# Patient Record
Sex: Female | Born: 1975 | Race: White | Hispanic: No | Marital: Married | Smoking: Never smoker
Health system: Southern US, Community
[De-identification: ages and names within clinical notes are randomized; demographics above are authoritative.]

## PROBLEM LIST (undated history)

## (undated) DIAGNOSIS — E049 Nontoxic goiter, unspecified: Secondary | ICD-10-CM

## (undated) DIAGNOSIS — Z8619 Personal history of other infectious and parasitic diseases: Secondary | ICD-10-CM

## (undated) DIAGNOSIS — N3010 Interstitial cystitis (chronic) without hematuria: Secondary | ICD-10-CM

## (undated) DIAGNOSIS — C801 Malignant (primary) neoplasm, unspecified: Secondary | ICD-10-CM

## (undated) DIAGNOSIS — Z9889 Other specified postprocedural states: Secondary | ICD-10-CM

## (undated) DIAGNOSIS — T8859XA Other complications of anesthesia, initial encounter: Secondary | ICD-10-CM

## (undated) DIAGNOSIS — R519 Headache, unspecified: Secondary | ICD-10-CM

## (undated) DIAGNOSIS — K219 Gastro-esophageal reflux disease without esophagitis: Secondary | ICD-10-CM

## (undated) DIAGNOSIS — R87619 Unspecified abnormal cytological findings in specimens from cervix uteri: Secondary | ICD-10-CM

## (undated) DIAGNOSIS — Z1371 Encounter for nonprocreative screening for genetic disease carrier status: Secondary | ICD-10-CM

## (undated) DIAGNOSIS — Z9189 Other specified personal risk factors, not elsewhere classified: Secondary | ICD-10-CM

## (undated) DIAGNOSIS — Z803 Family history of malignant neoplasm of breast: Secondary | ICD-10-CM

## (undated) DIAGNOSIS — B029 Zoster without complications: Secondary | ICD-10-CM

## (undated) DIAGNOSIS — R112 Nausea with vomiting, unspecified: Secondary | ICD-10-CM

## (undated) HISTORY — DX: Personal history of other infectious and parasitic diseases: Z86.19

## (undated) HISTORY — DX: Encounter for nonprocreative screening for genetic disease carrier status: Z13.71

## (undated) HISTORY — DX: Other specified personal risk factors, not elsewhere classified: Z91.89

## (undated) HISTORY — DX: Nontoxic goiter, unspecified: E04.9

## (undated) HISTORY — DX: Zoster without complications: B02.9

## (undated) HISTORY — DX: Family history of malignant neoplasm of breast: Z80.3

## (undated) HISTORY — DX: Unspecified abnormal cytological findings in specimens from cervix uteri: R87.619

## (undated) HISTORY — DX: Interstitial cystitis (chronic) without hematuria: N30.10

## (undated) HISTORY — PX: WISDOM TOOTH EXTRACTION: SHX21

---

## 1997-11-22 DIAGNOSIS — R87619 Unspecified abnormal cytological findings in specimens from cervix uteri: Secondary | ICD-10-CM

## 1997-11-22 HISTORY — DX: Unspecified abnormal cytological findings in specimens from cervix uteri: R87.619

## 1997-11-22 HISTORY — PX: COLPOSCOPY: SHX161

## 2003-11-23 HISTORY — PX: DILATION AND CURETTAGE OF UTERUS: SHX78

## 2005-05-14 ENCOUNTER — Inpatient Hospital Stay: Payer: Self-pay | Admitting: Unknown Physician Specialty

## 2007-01-30 ENCOUNTER — Ambulatory Visit: Payer: Self-pay

## 2009-11-20 ENCOUNTER — Ambulatory Visit: Payer: Self-pay | Admitting: Family Medicine

## 2010-03-04 ENCOUNTER — Ambulatory Visit: Payer: Self-pay | Admitting: Unknown Physician Specialty

## 2010-03-10 ENCOUNTER — Ambulatory Visit: Payer: Self-pay | Admitting: Unknown Physician Specialty

## 2010-12-30 ENCOUNTER — Ambulatory Visit: Payer: Self-pay

## 2011-04-05 ENCOUNTER — Ambulatory Visit: Payer: Self-pay | Admitting: Family Medicine

## 2013-11-30 ENCOUNTER — Ambulatory Visit: Payer: Self-pay | Admitting: Family Medicine

## 2014-10-04 ENCOUNTER — Ambulatory Visit: Payer: Self-pay | Admitting: Family Medicine

## 2015-06-19 ENCOUNTER — Encounter
Admission: RE | Admit: 2015-06-19 | Discharge: 2015-06-19 | Disposition: A | Discharge status: Home / Self Care | Payer: BLUE CROSS/BLUE SHIELD | Source: Ambulatory Visit | Attending: Obstetrics & Gynecology | Admitting: Obstetrics & Gynecology

## 2015-06-19 DIAGNOSIS — Z01812 Encounter for preprocedural laboratory examination: Secondary | ICD-10-CM | POA: Insufficient documentation

## 2015-06-19 HISTORY — DX: Gastro-esophageal reflux disease without esophagitis: K21.9

## 2015-06-19 LAB — BASIC METABOLIC PANEL
ANION GAP: 7 (ref 5–15)
BUN: 14 mg/dL (ref 6–20)
CHLORIDE: 106 mmol/L (ref 101–111)
CO2: 27 mmol/L (ref 22–32)
CREATININE: 0.6 mg/dL (ref 0.44–1.00)
Calcium: 9.4 mg/dL (ref 8.9–10.3)
Glucose, Bld: 97 mg/dL (ref 65–99)
POTASSIUM: 4.1 mmol/L (ref 3.5–5.1)
Sodium: 140 mmol/L (ref 135–145)

## 2015-06-19 LAB — CBC
HCT: 42.7 % (ref 35.0–47.0)
Hemoglobin: 14.5 g/dL (ref 12.0–16.0)
MCH: 31 pg (ref 26.0–34.0)
MCHC: 34 g/dL (ref 32.0–36.0)
MCV: 91.1 fL (ref 80.0–100.0)
Platelets: 304 10*3/uL (ref 150–440)
RBC: 4.68 MIL/uL (ref 3.80–5.20)
RDW: 12.3 % (ref 11.5–14.5)
WBC: 6.4 10*3/uL (ref 3.6–11.0)

## 2015-06-19 LAB — TYPE AND SCREEN
ABO/RH(D): O POS
Antibody Screen: NEGATIVE

## 2015-06-19 NOTE — Patient Instructions (Signed)
  Your procedure is scheduled on: June 27, 2015 (Friday) Report to Day Surgery. To find out your arrival time please call [phone] between 1PM - 3PM on June 26, 2015 (Thursday).  Remember: Instructions that are not followed completely may result in serious medical risk, up to and including death, or upon the discretion of your surgeon and anesthesiologist your surgery may need to be rescheduled.    __x__ 1. Do not eat food or drink liquids after midnight. No gum chewing or hard candies.     ____ 2. No Alcohol for 24 hours before or after surgery.   ____ 3. Bring all medications with you on the day of surgery if instructed.    __x__ 4. Notify your doctor if there is any change in your medical condition     (cold, fever, infections).     Do not wear jewelry, make-up, hairpins, clips or nail polish.  Do not wear lotions, powders, or perfumes. You may wear deodorant.  Do not shave 48 hours prior to surgery. Men may shave face and neck.  Do not bring valuables to the hospital.    [facility] is not responsible for any belongings or valuables.               Contacts, dentures or bridgework may not be worn into surgery.  Leave your suitcase in the car. After surgery it may be brought to your room.  For patients admitted to the hospital, discharge time is determined by your                treatment team.   Patients discharged the day of surgery will not be allowed to drive home.   Please read over the following fact sheets that you were given:     ____ Take these medicines the morning of surgery with A SIP OF WATER:    1.   2.   3.   4.  5.  6.  ____ Fleet Enema (as directed)   ____ Use CHG Soap as directed  ____ Use inhalers on the day of surgery  ____ Stop metformin 2 days prior to surgery    ____ Take 1/2 of usual insulin dose the night before surgery and none on the morning of surgery.   ____ Stop Coumadin/Plavix/aspirin on   ____ Stop Anti-inflammatories on     ____ Stop supplements until after surgery.    ____ Bring C-Pap to the hospital.

## 2015-06-20 LAB — ABO/RH: ABO/RH(D): O POS

## 2015-06-27 ENCOUNTER — Ambulatory Visit: Payer: BLUE CROSS/BLUE SHIELD | Admitting: Anesthesiology

## 2015-06-27 ENCOUNTER — Encounter
Admission: RE | Disposition: A | Discharge status: Home / Self Care | Payer: Self-pay | Source: Ambulatory Visit | Attending: Obstetrics & Gynecology

## 2015-06-27 ENCOUNTER — Encounter: Payer: Self-pay | Admitting: *Deleted

## 2015-06-27 ENCOUNTER — Ambulatory Visit
Admission: RE | Admit: 2015-06-27 | Discharge: 2015-06-27 | Disposition: A | Discharge status: Home / Self Care | Payer: BLUE CROSS/BLUE SHIELD | Source: Ambulatory Visit | Attending: Obstetrics & Gynecology | Admitting: Obstetrics & Gynecology

## 2015-06-27 DIAGNOSIS — K219 Gastro-esophageal reflux disease without esophagitis: Secondary | ICD-10-CM | POA: Diagnosis not present

## 2015-06-27 DIAGNOSIS — N939 Abnormal uterine and vaginal bleeding, unspecified: Secondary | ICD-10-CM | POA: Diagnosis present

## 2015-06-27 DIAGNOSIS — Z800 Family history of malignant neoplasm of digestive organs: Secondary | ICD-10-CM | POA: Insufficient documentation

## 2015-06-27 DIAGNOSIS — Z803 Family history of malignant neoplasm of breast: Secondary | ICD-10-CM | POA: Diagnosis not present

## 2015-06-27 DIAGNOSIS — Z8379 Family history of other diseases of the digestive system: Secondary | ICD-10-CM | POA: Diagnosis not present

## 2015-06-27 DIAGNOSIS — N840 Polyp of corpus uteri: Secondary | ICD-10-CM | POA: Insufficient documentation

## 2015-06-27 DIAGNOSIS — Z8049 Family history of malignant neoplasm of other genital organs: Secondary | ICD-10-CM | POA: Diagnosis not present

## 2015-06-27 DIAGNOSIS — Z808 Family history of malignant neoplasm of other organs or systems: Secondary | ICD-10-CM | POA: Insufficient documentation

## 2015-06-27 DIAGNOSIS — R938 Abnormal findings on diagnostic imaging of other specified body structures: Secondary | ICD-10-CM | POA: Diagnosis not present

## 2015-06-27 DIAGNOSIS — Z8249 Family history of ischemic heart disease and other diseases of the circulatory system: Secondary | ICD-10-CM | POA: Diagnosis not present

## 2015-06-27 HISTORY — PX: HYSTEROSCOPY WITH D & C: SHX1775

## 2015-06-27 LAB — POCT PREGNANCY, URINE: Preg Test, Ur: NEGATIVE

## 2015-06-27 SURGERY — DILATATION AND CURETTAGE /HYSTEROSCOPY
Anesthesia: General

## 2015-06-27 MED ORDER — LIDOCAINE HCL (CARDIAC) 20 MG/ML IV SOLN
INTRAVENOUS | Status: DC | PRN
  Administered 2015-06-27: 100 mg via INTRAVENOUS

## 2015-06-27 MED ORDER — IBUPROFEN 600 MG PO TABS: 600.0000 mg | ORAL_TABLET | Freq: Four times a day (QID) | ORAL | Status: DC | PRN

## 2015-06-27 MED ORDER — FAMOTIDINE 20 MG PO TABS
20.0000 mg | ORAL_TABLET | Freq: Once | ORAL | Status: AC
  Administered 2015-06-27: 20 mg via ORAL

## 2015-06-27 MED ORDER — FENTANYL CITRATE (PF) 100 MCG/2ML IJ SOLN
INTRAMUSCULAR | Status: DC | PRN
  Administered 2015-06-27 (×2): 50 ug via INTRAVENOUS

## 2015-06-27 MED ORDER — ONDANSETRON HCL 4 MG/2ML IJ SOLN
INTRAMUSCULAR | Status: DC | PRN
  Administered 2015-06-27: 4 mg via INTRAVENOUS

## 2015-06-27 MED ORDER — GLYCOPYRROLATE 0.2 MG/ML IJ SOLN
INTRAMUSCULAR | Status: DC | PRN
  Administered 2015-06-27: 0.2 mg via INTRAVENOUS

## 2015-06-27 MED ORDER — DEXAMETHASONE SODIUM PHOSPHATE 4 MG/ML IJ SOLN
INTRAMUSCULAR | Status: DC | PRN
  Administered 2015-06-27: 10 mg via INTRAVENOUS

## 2015-06-27 MED ORDER — LACTATED RINGERS IV SOLN
INTRAVENOUS | Status: DC
  Administered 2015-06-27: 13:00:00 via INTRAVENOUS

## 2015-06-27 MED ORDER — LIDOCAINE HCL (PF) 1 % IJ SOLN
INTRAMUSCULAR | Status: AC
  Filled 2015-06-27: qty 30

## 2015-06-27 MED ORDER — MIDAZOLAM HCL 2 MG/2ML IJ SOLN
INTRAMUSCULAR | Status: DC | PRN
  Administered 2015-06-27: 2 mg via INTRAVENOUS

## 2015-06-27 MED ORDER — PROPOFOL 10 MG/ML IV BOLUS
INTRAVENOUS | Status: DC | PRN
  Administered 2015-06-27: 160 mg via INTRAVENOUS

## 2015-06-27 MED ORDER — FAMOTIDINE 20 MG PO TABS
ORAL_TABLET | ORAL | Status: AC
  Administered 2015-06-27: 20 mg via ORAL
  Filled 2015-06-27: qty 1

## 2015-06-27 SURGICAL SUPPLY — 19 items
CATH ROBINSON RED A/P 16FR (CATHETERS) ×3 IMPLANT
CORD URO TURP 10FT (MISCELLANEOUS) IMPLANT
ELECT RESECT POWERBALL 24F (MISCELLANEOUS) IMPLANT
GLOVE BIOGEL PI IND STRL 6.5 (GLOVE) ×2 IMPLANT
GLOVE BIOGEL PI INDICATOR 6.5 (GLOVE) ×4
GLOVE SURG SYN 6.5 ES PF (GLOVE) ×6 IMPLANT
GOWN STRL REUS W/ TWL LRG LVL3 (GOWN DISPOSABLE) ×2 IMPLANT
GOWN STRL REUS W/TWL LRG LVL3 (GOWN DISPOSABLE) ×4
IV LACTATED RINGERS 1000ML (IV SOLUTION) ×3 IMPLANT
KIT RM TURNOVER CYSTO AR (KITS) ×3 IMPLANT
NEEDLE SPNL 22GX3.5 QUINCKE BK (NEEDLE) IMPLANT
PACK DNC HYST (MISCELLANEOUS) ×3 IMPLANT
PAD GROUND ADULT SPLIT (MISCELLANEOUS) ×3 IMPLANT
PAD OB MATERNITY 4.3X12.25 (PERSONAL CARE ITEMS) ×3 IMPLANT
PAD PREP 24X41 OB/GYN DISP (PERSONAL CARE ITEMS) ×3 IMPLANT
SYRINGE 10CC LL (SYRINGE) ×3 IMPLANT
TUBING CONNECTING 10 (TUBING) ×2 IMPLANT
TUBING CONNECTING 10' (TUBING) ×1
TUBING HYSTEROSCOPY DOLPHIN (MISCELLANEOUS) IMPLANT

## 2015-06-27 NOTE — Anesthesia Preprocedure Evaluation (Signed)
Anesthesia Evaluation  Patient identified by MRN, date of birth, ID band Patient awake    Reviewed: Allergy & Precautions, H&P , NPO status , Patient's Chart, lab work & pertinent test results, reviewed documented beta blocker date and time   History of Anesthesia Complications Negative for: history of anesthetic complications  Airway Mallampati: I  TM Distance: >3 FB Neck ROM: full    Dental no notable dental hx.    Pulmonary neg pulmonary ROS,  breath sounds clear to auscultation  Pulmonary exam normal       Cardiovascular Exercise Tolerance: Good negative cardio ROS Normal cardiovascular examRhythm:regular Rate:Normal     Neuro/Psych negative neurological ROS  negative psych ROS   GI/Hepatic Neg liver ROS, GERD-  ,  Endo/Other  negative endocrine ROS  Renal/GU negative Renal ROS  negative genitourinary   Musculoskeletal   Abdominal   Peds  Hematology negative hematology ROS (+)   Anesthesia Other Findings Past Medical History:   GERD (gastroesophageal reflux disease)                       Reproductive/Obstetrics negative OB ROS                             Anesthesia Physical Anesthesia Plan  ASA: II  Anesthesia Plan: General   Post-op Pain Management:    Induction:   Airway Management Planned:   Additional Equipment:   Intra-op Plan:   Post-operative Plan:   Informed Consent: I have reviewed the patients History and Physical, chart, labs and discussed the procedure including the risks, benefits and alternatives for the proposed anesthesia with the patient or authorized representative who has indicated his/her understanding and acceptance.   Dental Advisory Given  Plan Discussed with: Anesthesiologist, CRNA and Surgeon  Anesthesia Plan Comments:         Anesthesia Quick Evaluation

## 2015-06-27 NOTE — Anesthesia Procedure Notes (Signed)
Procedure Name: LMA Insertion Date/Time: 06/27/2015 1:01 PM Performed by: Silvana Newness Pre-anesthesia Checklist: Patient identified, Emergency Drugs available, Suction available, Patient being monitored and Timeout performed Patient Re-evaluated:Patient Re-evaluated prior to inductionOxygen Delivery Method: Circle system utilized Preoxygenation: Pre-oxygenation with 100% oxygen Intubation Type: IV induction Ventilation: Mask ventilation without difficulty LMA: LMA inserted LMA Size: 3.5 Number of attempts: 1 Placement Confirmation: ETT inserted through vocal cords under direct vision,  positive ETCO2 and breath sounds checked- equal and bilateral Tube secured with: Tape Dental Injury: Teeth and Oropharynx as per pre-operative assessment

## 2015-06-27 NOTE — Transfer of Care (Signed)
Immediate Anesthesia Transfer of Care Note  Patient: Amanda Coffey  Procedure(s) Performed: Procedure(s): DILATATION AND CURETTAGE /HYSTEROSCOPY (N/A)  Patient Location: PACU  Anesthesia Type:General  Level of Consciousness: awake, alert , oriented and patient cooperative  Airway & Oxygen Therapy: Patient Spontanous Breathing and Patient connected to face mask oxygen  Post-op Assessment: Report given to RN, Post -op Vital signs reviewed and stable and Patient moving all extremities X 4  Post vital signs: Reviewed and stable  Last Vitals:  Filed Vitals:   06/27/15 1350  BP: 130/88  Pulse: 92  Temp: 36.2 C  Resp: 17    Complications: No apparent anesthesia complications

## 2015-06-27 NOTE — Discharge Instructions (Signed)
AMBULATORY SURGERY  DISCHARGE INSTRUCTIONS   1) The drugs that you were given will stay in your system until tomorrow so for the next 24 hours you should not:  A) Drive an automobile B) Make any legal decisions C) Drink any alcoholic beverage   2) You may resume regular meals tomorrow.  Today it is better to start with liquids and gradually work up to solid foods.  You may eat anything you prefer, but it is better to start with liquids, then soup and crackers, and gradually work up to solid foods.   3) Please notify your doctor immediately if you have any unusual bleeding, trouble breathing, redness and pain at the surgery site, drainage, fever, or pain not relieved by medication.    4) Additional Instructions:   Dilation and Curettage or Vacuum Curettage, Care After Refer to this sheet in the next few weeks. These instructions provide you with information on caring for yourself after your procedure. Your health care provider may also give you more specific instructions. Your treatment has been planned according to current medical practices, but problems sometimes occur. Call your health care provider if you have any problems or questions after your procedure. WHAT TO EXPECT AFTER THE PROCEDURE After your procedure, it is typical to have light cramping and bleeding. This may last for 2 days to 2 weeks after the procedure. HOME CARE INSTRUCTIONS   Do not drive for 24 hours.  Wait 1 week before returning to strenuous activities.  Take your temperature 2 times a day for 4 days and write it down. Provide these temperatures to your health care provider if you develop a fever.  Avoid long periods of standing.  Avoid heavy lifting, pushing, or pulling. Do not lift anything heavier than 10 pounds (4.5 kg).  Limit stair climbing to once or twice a day.  Take rest periods often.  You may resume your usual diet.  Drink enough fluids to keep your urine clear or pale yellow.  Your  usual bowel function should return. If you have constipation, you may:  Take a mild laxative with permission from your health care provider.  Add fruit and bran to your diet.  Drink more fluids.  Take showers instead of baths until your health care provider gives you permission to take baths.  Do not go swimming or use a hot tub until your health care provider approves.  Try to have someone with you or available to you the first 24-48 hours, especially if you were given a general anesthetic.  Do not douche, use tampons, or have sex (intercourse) for 2 weeks after the procedure.  Only take over-the-counter or prescription medicines as directed by your health care provider. Do not take aspirin. It can cause bleeding.  Follow up with your health care provider as directed. SEEK MEDICAL CARE IF:   You have increasing cramps or pain that is not relieved with medicine.  You have abdominal pain that does not seem to be related to the same area of earlier cramping and pain.  You have bad smelling vaginal discharge.  You have a rash.  You are having problems with any medicine. SEEK IMMEDIATE MEDICAL CARE IF:   You have bleeding that is heavier than a normal menstrual period.  You have a fever.  You have chest pain.  You have shortness of breath.  You feel dizzy or feel like fainting.  You pass out.  You have pain in your shoulder strap area.  You have heavy  vaginal bleeding with or without blood clots. MAKE SURE YOU:   Understand these instructions.  Will watch your condition.  Will get help right away if you are not doing well or get worse. Document Released: 11/05/2000 Document Revised: 11/13/2013 Document Reviewed: 06/07/2013 [facility] Patient Information 2015 [place], [place]. This information is not intended to replace advice given to you by your health care provider. Make sure you discuss any questions you have with your health care provider.      Please contact  your physician with any problems or Same Day Surgery at [phone], Monday through Friday 6 am to 4 pm, or [place] at [facility] number at [phone].

## 2015-06-27 NOTE — Op Note (Signed)
Operative Report Hysteroscopy, Dilation and Curettage 06/27/2015  Patient:  Amanda Coffey  39 y.o. female Preoperative diagnosis:  ABNORMAL UTERINE BLEEDING, thickened endometrium Postoperative diagnosis:  same  PROCEDURE:  Procedure(s): DILATATION AND CURETTAGE /HYSTEROSCOPY (N/A) Surgeon:  Surgeon(s) and Role:    * Latessa Tillis Loletha Grayer Jacion Dismore, MD - Primary Assistant: none Anesthesia:  LMA I/O: Total I/O In: 700 [I.V.:700] Out: 325 [Urine:300; Blood:25] Specimens:  Endometrial curettings Complications: None Apparent Disposition:  VS stable to PACU  Findings: Uterus, mobile, normal size, sounding to 8 cm; normal cervix, vagina, perineum. Moderate endometrial tissue.  Indication for procedure/Consents: 39 y.o. No obstetric history on file.  here for scheduled surgery for the aforementioned diagnoses.  Risks of surgery were discussed with the patient including but not limited to: bleeding which may require transfusion; infection which may require antibiotics; injury to uterus or surrounding organs; intrauterine scarring which may impair future fertility; need for additional procedures including laparotomy or laparoscopy; and other postoperative/anesthesia complications. Written informed consent was obtained.    Procedure Details:   The patient was then taken to the operating room where anesthesia was administered and was found to be adequate.  After a formal and adequate timeout was performed, she was placed in the dorsal lithotomy position and examined with the above findings. She was then prepped and draped in the sterile manner.  A speculum was then placed in the patient's vagina and a single tooth tenaculum was applied to the anterior lip of the cervix.  A paracervical block was placed.  The uterus was sounded to 8 cm. Her cervix was serially dilated to accommodate the hysteroscope, with findings as above. A sharp curettage was then performed until there was a gritty texture in all four quadrants.  The specimen(s) was handed off to nursing.  The camera was reinserted and confirmed the uterus had been evacuated. The tenaculum was removed from the anterior lip of the cervix and the vaginal speculum was removed after noting good hemostasis. The patient tolerated the procedure well and was taken to the recovery area awake, extubated and in stable condition.  The patient will be discharged to home as per PACU criteria.  Routine postoperative instructions given. She will follow up in the clinic in two to four weeks for postoperative evaluation.  Larey Days, MD [facility] OBGYN Attending Gynecologist

## 2015-06-27 NOTE — Progress Notes (Signed)
H&P Update  Pt was last seen in my office, and complete history and physical performed.  The surgical history has been reviewed and remains accurate without interval change. The patient was re-examined and patient's physiologic condition has not changed significantly in the last 30 days.  No new pharmacological allergies or types of therapy has been initiated.  No Known Allergies  Past Medical History  Diagnosis Date  . GERD (gastroesophageal reflux disease)    Past Surgical History  Procedure Laterality Date  . Dilation and curettage of uterus      BP 133/83 mmHg  Pulse 74  Temp(Src) 98.1 F (36.7 C) (Tympanic)  Resp 16  SpO2 99%  LMP 06/03/2015  NAD RRR no murmurs CTAB, no wheezing, resps unlabored +BS, soft, NTTP No c/c/e Pelvic exam deferred  The above history was confirmed with the patient. The condition still exists that makes this procedure necessary. Surgical plan includes dilation and curettage, hysteroscopy, as confirmed on the consent. The treatment plan remains the same, without new options for care.  The patient understands the potential benefits and risks and the consents have been signed and placed on the chart.     Larey Days, MD Attending Obstetrician Gynecologist [facility]

## 2015-06-30 ENCOUNTER — Encounter: Payer: Self-pay | Admitting: Obstetrics & Gynecology

## 2015-06-30 LAB — SURGICAL PATHOLOGY

## 2015-07-02 NOTE — Anesthesia Postprocedure Evaluation (Signed)
  Anesthesia Post-op Note  Patient: Amanda Coffey  Procedure(s) Performed: Procedure(s): DILATATION AND CURETTAGE /HYSTEROSCOPY (N/A)  Anesthesia type:General  Patient location: PACU  Post pain: Pain level controlled  Post assessment: Post-op Vital signs reviewed, Patient's Cardiovascular Status Stable, Respiratory Function Stable, Patent Airway and No signs of Nausea or vomiting  Post vital signs: Reviewed and stable  Last Vitals:  Filed Vitals:   06/27/15 1515  BP: 126/69  Pulse: 72  Temp:   Resp:     Level of consciousness: awake, alert  and patient cooperative  Complications: No apparent anesthesia complications

## 2016-03-01 ENCOUNTER — Encounter: Payer: Self-pay | Admitting: Pediatrics

## 2016-04-20 ENCOUNTER — Encounter: Payer: Self-pay | Admitting: Family Medicine

## 2016-04-20 ENCOUNTER — Ambulatory Visit (INDEPENDENT_AMBULATORY_CARE_PROVIDER_SITE_OTHER): Payer: BLUE CROSS/BLUE SHIELD | Admitting: Family Medicine

## 2016-04-20 VITALS — BP 116/80 | HR 97 | Temp 98.8°F | Resp 16 | Wt 192.0 lb

## 2016-04-20 DIAGNOSIS — N390 Urinary tract infection, site not specified: Secondary | ICD-10-CM | POA: Diagnosis not present

## 2016-04-20 DIAGNOSIS — R1030 Lower abdominal pain, unspecified: Secondary | ICD-10-CM

## 2016-04-20 DIAGNOSIS — R799 Abnormal finding of blood chemistry, unspecified: Secondary | ICD-10-CM | POA: Insufficient documentation

## 2016-04-20 DIAGNOSIS — R220 Localized swelling, mass and lump, head: Secondary | ICD-10-CM | POA: Insufficient documentation

## 2016-04-20 DIAGNOSIS — R319 Hematuria, unspecified: Secondary | ICD-10-CM | POA: Diagnosis not present

## 2016-04-20 DIAGNOSIS — R748 Abnormal levels of other serum enzymes: Secondary | ICD-10-CM | POA: Insufficient documentation

## 2016-04-20 DIAGNOSIS — N3010 Interstitial cystitis (chronic) without hematuria: Secondary | ICD-10-CM | POA: Insufficient documentation

## 2016-04-20 DIAGNOSIS — M67439 Ganglion, unspecified wrist: Secondary | ICD-10-CM | POA: Insufficient documentation

## 2016-04-20 LAB — POCT URINALYSIS DIPSTICK
BILIRUBIN UA: NEGATIVE
Glucose, UA: NEGATIVE
Ketones, UA: NEGATIVE
Leukocytes, UA: NEGATIVE
Nitrite, UA: NEGATIVE
Spec Grav, UA: 1.025
Urobilinogen, UA: 0.2
pH, UA: 5

## 2016-04-20 LAB — POCT URINE PREGNANCY: Preg Test, Ur: NEGATIVE

## 2016-04-20 MED ORDER — SULFAMETHOXAZOLE-TRIMETHOPRIM 800-160 MG PO TABS: 1.0000 | ORAL_TABLET | Freq: Two times a day (BID) | ORAL | Status: AC

## 2016-04-20 NOTE — Progress Notes (Signed)
Patient: Amanda Coffey Female    DOB: 08/20/1976   40 y.o.   MRN: [id number] Visit Date: 04/20/2016  Today's Provider: Lelon Huh, MD   Chief Complaint  Patient presents with  . Abdominal Pain   Subjective:    Abdominal Pain This is a new problem. The current episode started yesterday. The problem has been unchanged. Pain location: lower abdomen. The quality of the pain is cramping and dull. The abdominal pain radiates to the perineum. Associated symptoms include anorexia, frequency, headaches and nausea. Pertinent negatives include no arthralgias, belching, constipation, diarrhea, dysuria, fever, flatus, hematuria, melena, myalgias, vomiting or weight loss. The pain is aggravated by urination. She has tried nothing for the symptoms.       No Known Allergies Previous Medications   CALCIUM CARB-CHOLECALCIFEROL (CALCIUM 500 + D3 PO)    Take 2 tablets by mouth daily. Reported on 04/20/2016   CETIRIZINE HCL (ZYRTEC ALLERGY) 10 MG CAPS    Take 1 tablet by mouth daily.   IBUPROFEN (ADVIL,MOTRIN) 600 MG TABLET    Take 1 tablet (600 mg total) by mouth every 6 (six) hours as needed for moderate pain.    Review of Systems  Constitutional: Negative for fever, chills, weight loss, appetite change and fatigue.  Respiratory: Negative for chest tightness and shortness of breath.   Cardiovascular: Negative for chest pain and palpitations.  Gastrointestinal: Positive for nausea, abdominal pain and anorexia. Negative for vomiting, diarrhea, constipation, melena and flatus.  Genitourinary: Positive for frequency and pelvic pain. Negative for dysuria and hematuria.  Musculoskeletal: Negative for myalgias and arthralgias.  Neurological: Positive for headaches. Negative for dizziness and weakness.    Social History  Substance Use Topics  . Smoking status: Never Smoker   . Smokeless tobacco: Never Used  . Alcohol Use: No   Objective:   BP 116/80 mmHg  Pulse 97  Temp(Src) 98.8 F  (37.1 C) (Oral)  Resp 16  Wt 192 lb (87.091 kg)  SpO2 95%  LMP  (Within Weeks)  Physical Exam   General Appearance:    Alert, cooperative, no distress  Eyes:    PERRL, conjunctiva/corneas clear, EOM's intact       Lungs:     Clear to auscultation bilaterally, respirations unlabored  Heart:    Regular rate and rhythm  Abdomen:   Mild suprapubic tenderness, no masses, no rebound or guarding. Normoactive bowel sounds. . No CVA tenderness   Results for orders placed or performed in visit on 04/20/16  POCT urinalysis dipstick  Result Value Ref Range   Color, UA Amber    Clarity, UA clear    Glucose, UA Negative    Bilirubin, UA Negative    Ketones, UA negative    Spec Grav, UA 1.025    Blood, UA Moderate (Hemolyzed)    pH, UA 5.0    Protein, UA Trace    Urobilinogen, UA 0.2    Nitrite, UA Negative    Leukocytes, UA Negative Negative  POCT urine pregnancy  Result Value Ref Range   Preg Test, Ur Negative Negative        Assessment & Plan:     1. Lower abdominal pain UTI versus interstitial cystitis. Start Septra DS BID x 7 days while awaiting culture results.  - POCT urinalysis dipstick - POCT urine pregnancy  2. Urinary tract infection with hematuria, site unspecified  - Urine Culture       Lelon Huh, MD  [facility]  [facility]

## 2016-04-22 LAB — URINE CULTURE

## 2016-04-29 ENCOUNTER — Telehealth: Payer: Self-pay | Admitting: *Deleted

## 2016-04-29 NOTE — Telephone Encounter (Signed)
Patient was notified.

## 2016-04-29 NOTE — Telephone Encounter (Signed)
She can just bring sample. Thanks.

## 2016-04-29 NOTE — Telephone Encounter (Signed)
Patient was given urine results. Patient stated the pain has resolved. Does patient need an ov to recheck urine or can she just stop by office to leave a sample?

## 2016-05-06 ENCOUNTER — Other Ambulatory Visit (INDEPENDENT_AMBULATORY_CARE_PROVIDER_SITE_OTHER): Payer: BLUE CROSS/BLUE SHIELD

## 2016-05-06 DIAGNOSIS — R319 Hematuria, unspecified: Secondary | ICD-10-CM | POA: Diagnosis not present

## 2016-05-06 LAB — POCT URINALYSIS DIPSTICK
GLUCOSE UA: NEGATIVE
Ketones, UA: NEGATIVE
Leukocytes, UA: NEGATIVE
NITRITE UA: NEGATIVE
Protein, UA: NEGATIVE
Spec Grav, UA: >=1.03
UROBILINOGEN UA: 0.2
pH, UA: 5

## 2016-11-03 LAB — HM PAP SMEAR: HM PAP: NORMAL

## 2016-11-04 ENCOUNTER — Other Ambulatory Visit: Payer: Self-pay | Admitting: Certified Nurse Midwife

## 2016-11-04 DIAGNOSIS — Z1231 Encounter for screening mammogram for malignant neoplasm of breast: Secondary | ICD-10-CM

## 2016-12-10 ENCOUNTER — Ambulatory Visit
Admission: RE | Admit: 2016-12-10 | Discharge: 2016-12-10 | Disposition: A | Discharge status: Home / Self Care | Payer: BLUE CROSS/BLUE SHIELD | Source: Ambulatory Visit | Attending: Certified Nurse Midwife | Admitting: Certified Nurse Midwife

## 2016-12-10 DIAGNOSIS — Z1231 Encounter for screening mammogram for malignant neoplasm of breast: Secondary | ICD-10-CM | POA: Insufficient documentation

## 2016-12-10 LAB — HM MAMMOGRAPHY

## 2016-12-27 ENCOUNTER — Ambulatory Visit (INDEPENDENT_AMBULATORY_CARE_PROVIDER_SITE_OTHER): Payer: BLUE CROSS/BLUE SHIELD | Admitting: Family Medicine

## 2016-12-27 ENCOUNTER — Encounter: Payer: Self-pay | Admitting: Family Medicine

## 2016-12-27 VITALS — BP 130/84 | HR 88 | Temp 98.8°F | Wt 186.2 lb

## 2016-12-27 DIAGNOSIS — R7989 Other specified abnormal findings of blood chemistry: Secondary | ICD-10-CM

## 2016-12-27 DIAGNOSIS — R519 Headache, unspecified: Secondary | ICD-10-CM

## 2016-12-27 DIAGNOSIS — E786 Lipoprotein deficiency: Secondary | ICD-10-CM

## 2016-12-27 DIAGNOSIS — R51 Headache: Secondary | ICD-10-CM

## 2016-12-27 MED ORDER — FAMCICLOVIR 500 MG PO TABS: 500.0000 mg | ORAL_TABLET | Freq: Three times a day (TID) | ORAL | 0 refills | Status: DC

## 2016-12-27 MED ORDER — CEPHALEXIN 500 MG PO CAPS: 500.0000 mg | ORAL_CAPSULE | Freq: Three times a day (TID) | ORAL | 0 refills | Status: DC

## 2016-12-27 NOTE — Progress Notes (Signed)
Patient: Amanda Coffey Female    DOB: [birth year]   41 y.o.   MRN: [id number] Visit Date: 12/27/2016  Today's Provider: Vernie Murders, PA   Chief Complaint  Patient presents with  . Shoulder Pain   Subjective:    Shoulder Pain   The pain is present in the right shoulder, neck and back. This is a new problem. Episode onset: 2 weeks ago. The problem occurs constantly. The problem has been gradually improving.  Patient reports neck tenderness and pain that radiates down arm. Patient also states she is fatigue and head is itching. She has a PMH of shingles. Patient has tried nothing for symptoms. Itching sensation with burning to light touch started over the past 3-4 days. No further pain in neck and shoulder since using the Ibuprofen. No known injury.  Patient Active Problem List   Diagnosis Date Noted  . Abnormal blood chemistry 04/20/2016  . Abnormal liver enzymes 04/20/2016  . Interstitial cystitis 04/20/2016  . Ganglion cyst of wrist 04/20/2016  . 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 Herpes zoster without complication 97/58/8325   Past Surgical History:  Procedure Laterality Date  . DILATION AND CURETTAGE OF UTERUS  2005   due to miscarriage  . HYSTEROSCOPY W/D&C N/A 06/27/2015   Procedure: DILATATION AND CURETTAGE /HYSTEROSCOPY;  Surgeon: Honor Loh Ward, MD;  Location: [facility] ORS;  Service: Gynecology;  Laterality: N/A;   Family History  Problem Relation Age of Onset  . Hypertension Mother   . Breast cancer Mother 61  . Crohn's disease Father   . Scoliosis Sister   . Crohn's disease Paternal Grandmother   . Breast cancer Paternal Grandmother   . CAD Paternal Grandfather     stent  . Breast cancer Cousin    No Known Allergies   Previous Medications   CALCIUM CARB-CHOLECALCIFEROL (CALCIUM 500 + D3 PO)    Take 2 tablets by mouth daily. Reported on 04/20/2016   CETIRIZINE HCL (ZYRTEC ALLERGY) 10 MG CAPS    Take 1 tablet by mouth daily.   IBUPROFEN (ADVIL,MOTRIN) 600 MG TABLET     Take 1 tablet (600 mg total) by mouth every 6 (six) hours as needed for moderate pain.    Review of Systems  Constitutional: Negative.   Respiratory: Negative.   Cardiovascular: Negative.   Musculoskeletal: Positive for arthralgias, myalgias and neck pain.  Skin:       Itching     Social History  Substance Use Topics  . Smoking status: Never Smoker  . Smokeless tobacco: Never Used  . Alcohol use No   Objective:   BP 130/84 (BP Location: Right Arm, Patient Position: Sitting, Cuff Size: Normal)   Pulse 88   Temp 98.8 F (37.1 C) (Oral)   Wt 186 lb 3.2 oz (84.5 kg)   LMP 12/04/2016   BMI 29.16 kg/m  Wt Readings from Last 3 Encounters:  12/27/16 186 lb 3.2 oz (84.5 kg)  04/20/16 192 lb (87.1 kg)  10/04/14 194 lb (88 kg)    Physical Exam  Constitutional: She is oriented to person, place, and time. She appears well-developed and well-nourished. No distress.  HENT:  Head: Normocephalic and atraumatic.  Right Ear: Hearing normal.  Left Ear: Hearing normal.  Nose: Nose normal.  Eyes: Conjunctivae and lids are normal. Right eye exhibits no discharge. Left eye exhibits no discharge. No scleral icterus.  Neck: Neck supple.  Cardiovascular: Normal rate and regular rhythm.   Pulmonary/Chest: Effort normal and breath sounds normal. No respiratory distress.  Abdominal: Soft.  Musculoskeletal: Normal range of motion. She exhibits tenderness.  Right upper back, shoulder and neck tenderness with itching radiating down the right arm. No weakness/  Lymphadenopathy:    She has cervical adenopathy.  Neurological: She is alert and oriented to person, place, and time. She has normal reflexes.  Skin: Skin is intact. No lesion and no rash noted.  Psychiatric: She has a normal mood and affect. Her speech is normal and behavior is normal. Thought content normal.      Assessment & Plan:     1. Cholesterol blood lowered GYN notice total cholesterol low at 90, triglycerides 73, HDL 48 and  LDL 27 on lab report form 11-03-16. Requested further evaluation by her PCP. Other than arm and neck discomfort (suspected shingles), no other symptoms. Will check CMP and Vitamin-D level. Recommend regular exercise and balanced diet. Recheck lipids in a month pending these lab reports. - Comprehensive metabolic panel - VITAMIN D 25 Hydroxy (Vit-D Deficiency, Fractures)  2. Scalp pain Itching and burning pain radiating down the right neck to the arm with light touch. No rashes noted. Symptoms began 3-4 days ago and are similar to her last shingles episode 8 years ago. Will check CBC and start Famvir with Keflex with some cervical nodes slightly enlarged. Follow up pending lab reports. - CBC with Differential/Platelet - famciclovir (FAMVIR) 500 MG tablet; Take 1 tablet (500 mg total) by mouth 3 (three) times daily.  Dispense: 21 tablet; Refill: 0 - cephALEXin (KEFLEX) 500 MG capsule; Take 1 capsule (500 mg total) by mouth 3 (three) times daily.  Dispense: 21 capsule; Refill: 0

## 2016-12-28 ENCOUNTER — Telehealth: Payer: Self-pay

## 2016-12-28 LAB — CBC WITH DIFFERENTIAL/PLATELET
BASOS ABS: 0 10*3/uL (ref 0.0–0.2)
BASOS: 0 %
EOS (ABSOLUTE): 0.1 10*3/uL (ref 0.0–0.4)
Eos: 1 %
Hematocrit: 41.4 % (ref 34.0–46.6)
Hemoglobin: 14 g/dL (ref 11.1–15.9)
IMMATURE GRANULOCYTES: 0 %
Immature Grans (Abs): 0 10*3/uL (ref 0.0–0.1)
LYMPHS: 25 %
Lymphocytes Absolute: 2.6 10*3/uL (ref 0.7–3.1)
MCH: 30.3 pg (ref 26.6–33.0)
MCHC: 33.8 g/dL (ref 31.5–35.7)
MCV: 90 fL (ref 79–97)
MONOS ABS: 0.8 10*3/uL (ref 0.1–0.9)
Monocytes: 8 %
NEUTROS PCT: 66 %
Neutrophils Absolute: 6.6 10*3/uL (ref 1.4–7.0)
PLATELETS: 370 10*3/uL (ref 150–379)
RBC: 4.62 x10E6/uL (ref 3.77–5.28)
RDW: 13.1 % (ref 12.3–15.4)
WBC: 10 10*3/uL (ref 3.4–10.8)

## 2016-12-28 LAB — COMPREHENSIVE METABOLIC PANEL
ALT: 10 IU/L (ref 0–32)
AST: 9 IU/L (ref 0–40)
Albumin/Globulin Ratio: 1.6 (ref 1.2–2.2)
Albumin: 4.4 g/dL (ref 3.5–5.5)
Alkaline Phosphatase: 69 IU/L (ref 39–117)
BILIRUBIN TOTAL: 0.2 mg/dL (ref 0.0–1.2)
BUN/Creatinine Ratio: 18 (ref 9–23)
BUN: 13 mg/dL (ref 6–24)
CALCIUM: 9.7 mg/dL (ref 8.7–10.2)
CHLORIDE: 100 mmol/L (ref 96–106)
CO2: 25 mmol/L (ref 18–29)
Creatinine, Ser: 0.72 mg/dL (ref 0.57–1.00)
GFR calc Af Amer: 120 mL/min/{1.73_m2} (ref >59)
GFR calc non Af Amer: 104 mL/min/{1.73_m2} (ref >59)
GLUCOSE: 100 mg/dL — AB (ref 65–99)
Globulin, Total: 2.7 g/dL (ref 1.5–4.5)
POTASSIUM: 4.3 mmol/L (ref 3.5–5.2)
Sodium: 139 mmol/L (ref 134–144)
Total Protein: 7.1 g/dL (ref 6.0–8.5)

## 2016-12-28 LAB — VITAMIN D 25 HYDROXY (VIT D DEFICIENCY, FRACTURES): VIT D 25 HYDROXY: 22.7 ng/mL — AB (ref 30.0–100.0)

## 2016-12-28 MED ORDER — VITAMIN D 50 MCG (2000 UT) PO TABS: 2000.0000 [IU] | ORAL_TABLET | Freq: Two times a day (BID) | ORAL | Status: DC

## 2016-12-28 NOTE — Telephone Encounter (Signed)
Advised pt of lab results. Pt verbally acknowledges understanding. Vitamin D added to med list. Pt will call back to schedule FU. Renaldo Fiddler, CMA

## 2016-12-28 NOTE — Telephone Encounter (Signed)
-----   Message from Margo [place], [place] sent at 12/28/2016  7:56 AM EST ----- All tests normal except Vitamin-D level below normal. Recommend balanced diet, regular exercise (30 minutes 3-4 days a week) and Vitamin-D 2000 IU BID. Recheck lipids and vitamin-D levels in 3 months to check progress.

## 2017-01-19 ENCOUNTER — Encounter: Payer: Self-pay | Admitting: Family Medicine

## 2017-03-31 ENCOUNTER — Other Ambulatory Visit: Payer: Self-pay | Admitting: Family Medicine

## 2017-03-31 ENCOUNTER — Telehealth: Payer: Self-pay

## 2017-03-31 DIAGNOSIS — E559 Vitamin D deficiency, unspecified: Secondary | ICD-10-CM

## 2017-03-31 NOTE — Telephone Encounter (Signed)
Order for Vitamin-D level put in chart to be released when she comes by to pick it up.

## 2017-03-31 NOTE — Telephone Encounter (Signed)
Patient called office requesting a lab order to recheck Vitamin D. KW

## 2017-04-04 ENCOUNTER — Other Ambulatory Visit: Payer: Self-pay

## 2017-04-04 ENCOUNTER — Encounter: Payer: Self-pay | Admitting: Family Medicine

## 2017-04-04 ENCOUNTER — Ambulatory Visit (INDEPENDENT_AMBULATORY_CARE_PROVIDER_SITE_OTHER): Payer: BLUE CROSS/BLUE SHIELD | Admitting: Family Medicine

## 2017-04-04 DIAGNOSIS — E559 Vitamin D deficiency, unspecified: Secondary | ICD-10-CM

## 2017-04-04 DIAGNOSIS — Z23 Encounter for immunization: Secondary | ICD-10-CM | POA: Diagnosis not present

## 2017-04-04 NOTE — Progress Notes (Signed)
Up dated tetanus immunization.

## 2017-04-05 ENCOUNTER — Telehealth: Payer: Self-pay

## 2017-04-05 LAB — VITAMIN D 25 HYDROXY (VIT D DEFICIENCY, FRACTURES): Vit D, 25-Hydroxy: 41.1 ng/mL (ref 30.0–100.0)

## 2017-04-05 NOTE — Telephone Encounter (Signed)
Advised pt of lab results. Pt verbally acknowledges understanding. Renaldo Fiddler, CMA

## 2017-04-05 NOTE — Telephone Encounter (Signed)
-----   Message from [place], [place] sent at 04/05/2017  8:22 AM EDT ----- Vitamin D level is normal range now and you shouldn't need more than 2000 IU daily to maintain this level. Recheck level in 6 months.

## 2017-04-23 IMAGING — MG MM DIGITAL SCREENING BILAT W/ TOMO W/ CAD
8 of 12 series · 8 of 28 positions shown · non-contrast
Comparison: Previous exam(s).

CLINICAL DATA: Screening.

EXAM:
2D DIGITAL SCREENING BILATERAL MAMMOGRAM WITH CAD AND ADJUNCT TOMO

[R CC synth-2D]
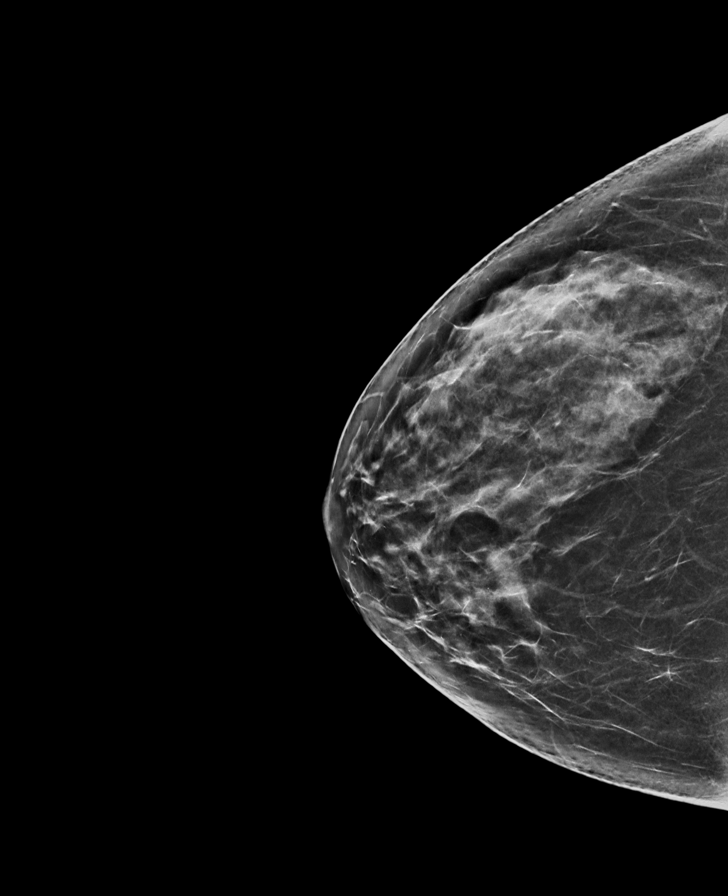

[R MLO synth-2D]
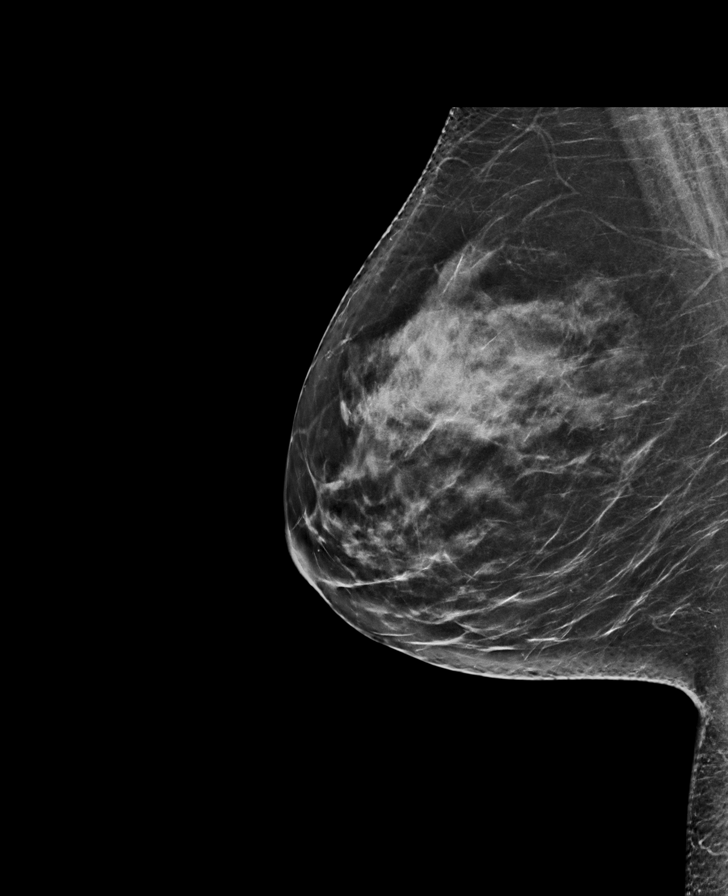

[L MLO]
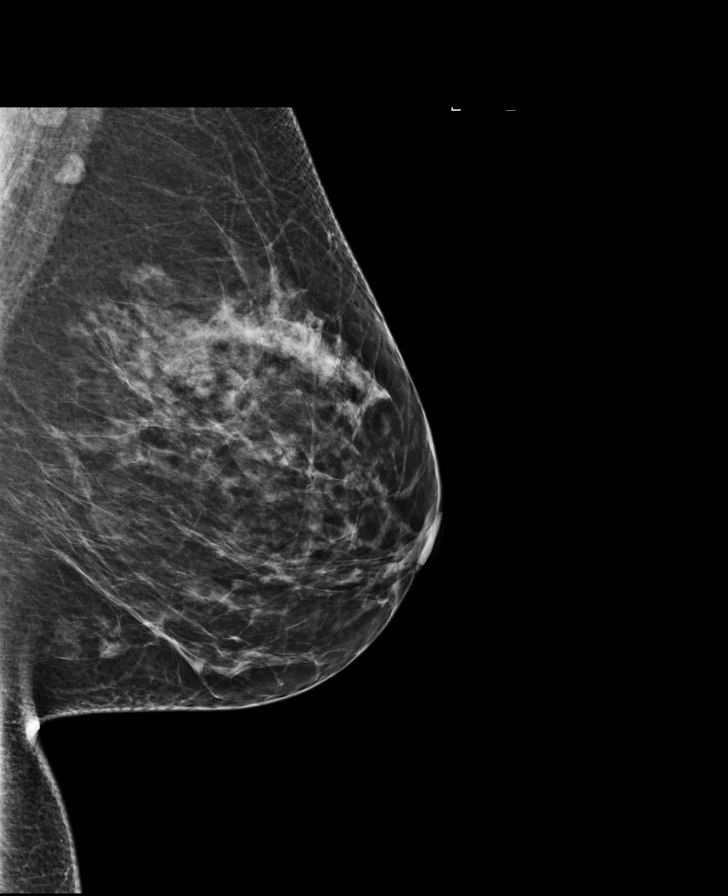

[L CC synth-2D]
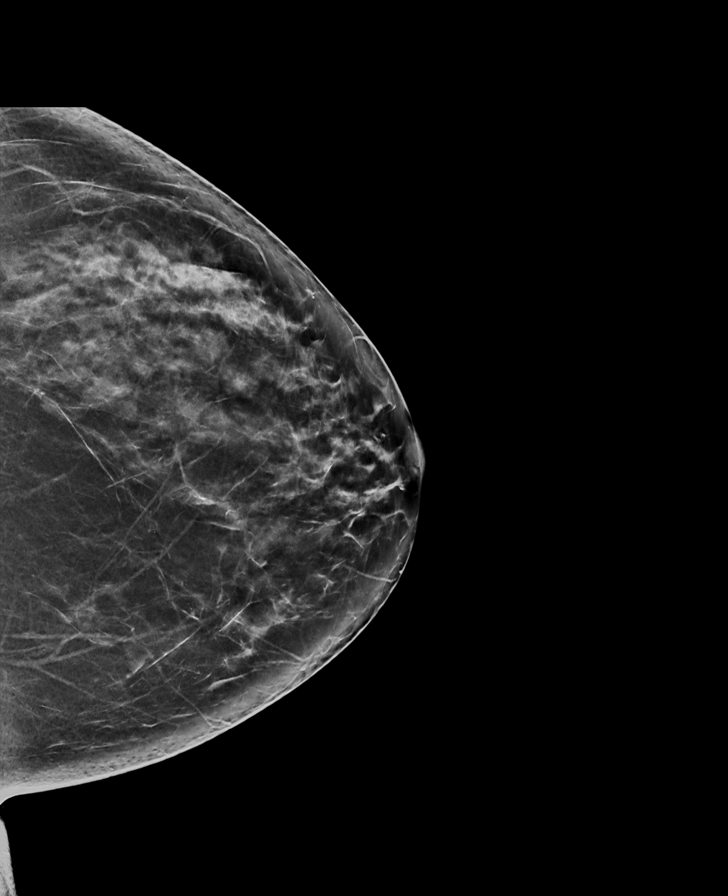

[R CC]
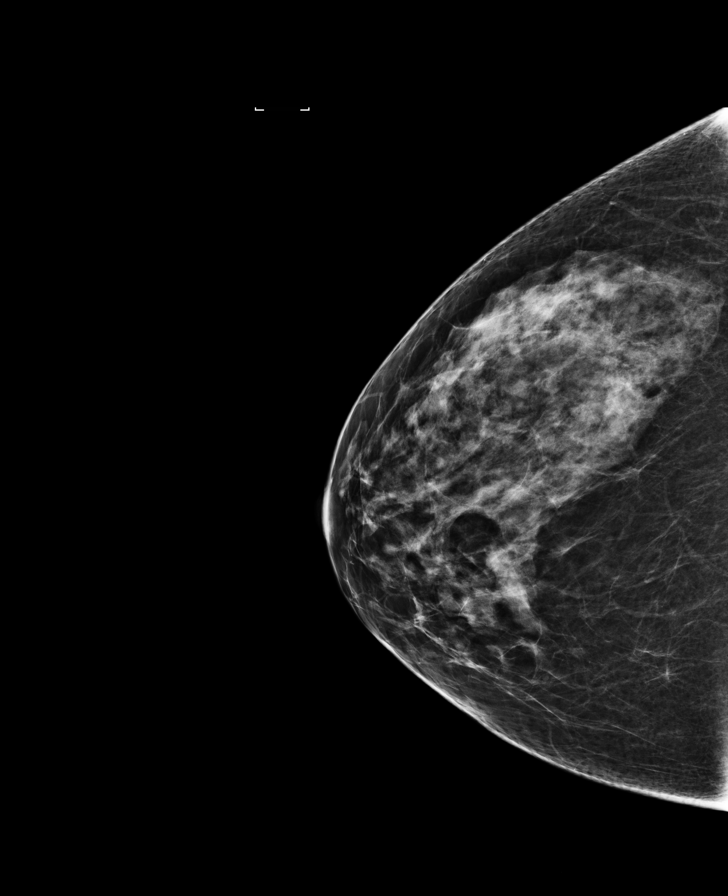

[L MLO synth-2D]
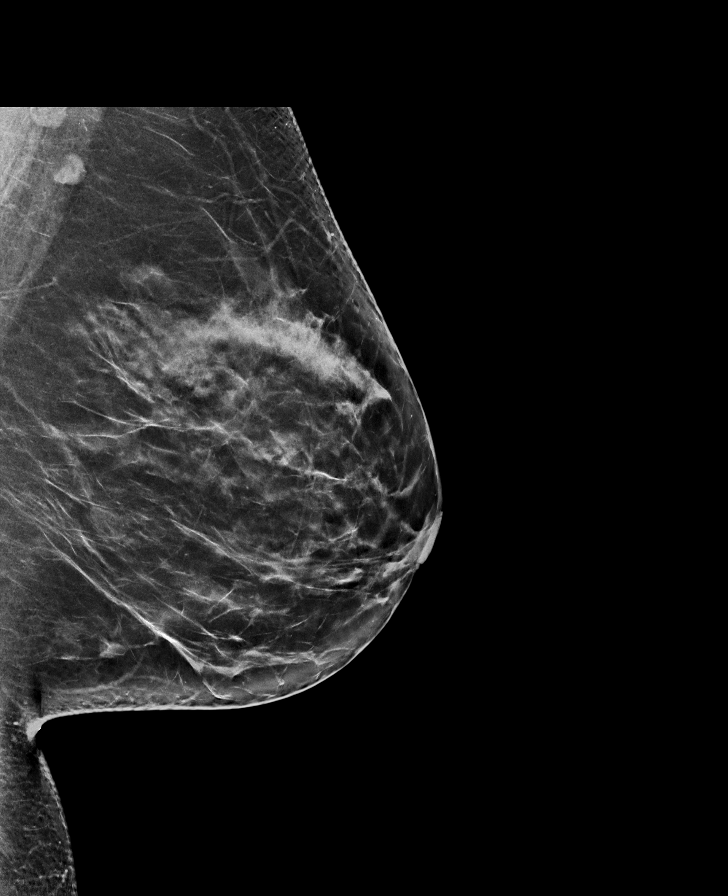

[L CC]
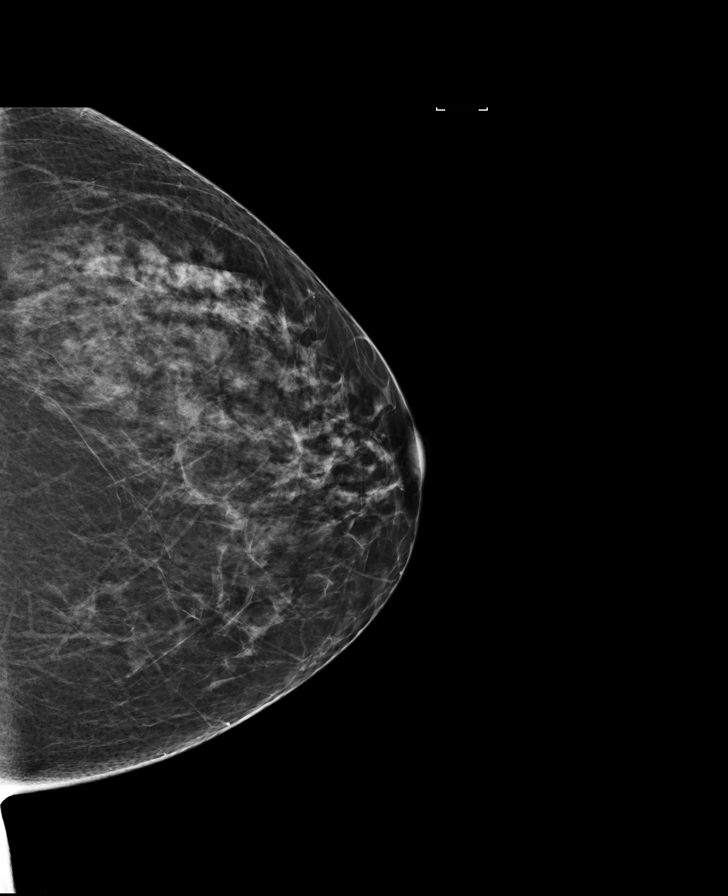

[R MLO]
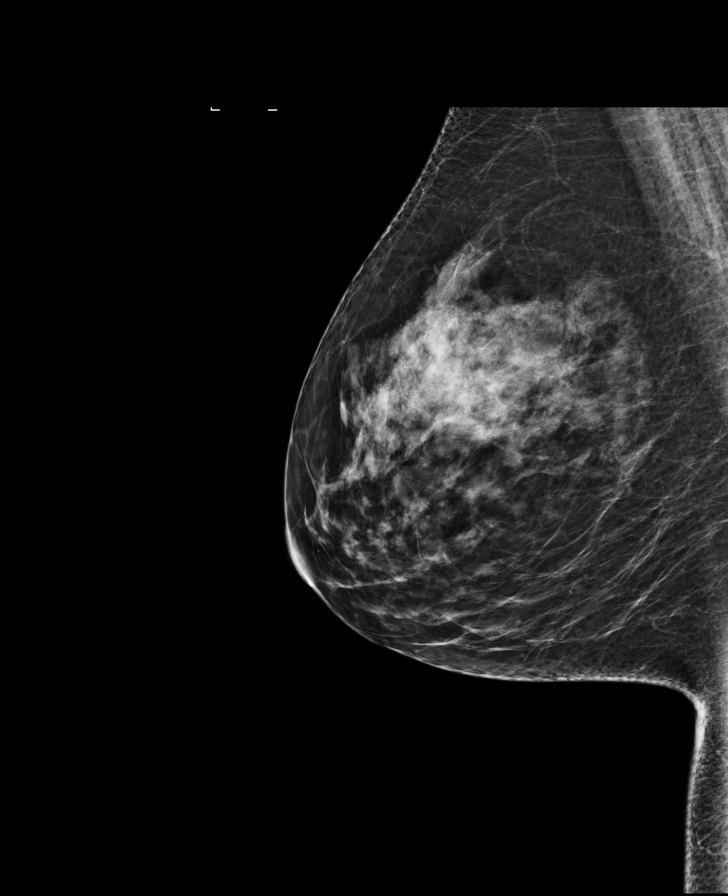

[8 of 28 positions shown; findings below may reference images not displayed]

ACR Breast Density Category c: The breast tissue is heterogeneously
dense, which may obscure small masses.
FINDINGS: There are no findings suspicious for malignancy. Images were
processed with CAD.
IMPRESSION: No mammographic evidence of malignancy. A result letter of this
screening mammogram will be mailed directly to the patient.

RECOMMENDATION:
Screening mammogram in one year. (Code:[id number]-[id number])

BI-RADS CATEGORY  1: Negative.

## 2017-12-21 ENCOUNTER — Ambulatory Visit: Payer: Self-pay | Admitting: Certified Nurse Midwife

## 2017-12-26 ENCOUNTER — Encounter: Payer: Self-pay | Admitting: Family Medicine

## 2017-12-26 ENCOUNTER — Ambulatory Visit: Payer: BLUE CROSS/BLUE SHIELD | Admitting: Family Medicine

## 2017-12-26 VITALS — BP 120/88 | HR 96 | Temp 99.3°F | Resp 16 | Wt 196.0 lb

## 2017-12-26 DIAGNOSIS — J069 Acute upper respiratory infection, unspecified: Secondary | ICD-10-CM | POA: Diagnosis not present

## 2017-12-26 DIAGNOSIS — J029 Acute pharyngitis, unspecified: Secondary | ICD-10-CM | POA: Diagnosis not present

## 2017-12-26 LAB — POCT RAPID STREP A (OFFICE): RAPID STREP A SCREEN: NEGATIVE

## 2017-12-26 NOTE — Progress Notes (Signed)
p      Patient: Amanda Coffey Female    DOB: 1975-12-13   42 y.o.   MRN: [id number] Visit Date: 12/26/2017  Today's Provider: Lelon Huh, MD   Chief Complaint  Patient presents with  . Sore Throat   Subjective:    Patient has has had a sore throat for 2 days. Other symptoms include: congestion, ear pain, headaches. Patient has been taking otc dayquil with mild relief.    Sore Throat   This is a new problem. The current episode started in the past 7 days (2 days ). The problem has been unchanged. The maximum temperature recorded prior to her arrival was 101 - 101.9 F. Associated symptoms include congestion, ear pain and headaches. Pertinent negatives include no abdominal pain, coughing, diarrhea, drooling, ear discharge, hoarse voice, plugged ear sensation, neck pain, shortness of breath, stridor, swollen glands, trouble swallowing or vomiting. She has had exposure to strep. Treatments tried: dayquil. The treatment provided mild relief.   Right ear is hurting today, but is otherwise feeling better.    No Known Allergies   Current Outpatient Medications:  .  Calcium Carb-Cholecalciferol (CALCIUM 500 + D3 PO), Take 2 tablets by mouth daily. Reported on 04/20/2016, Disp: , Rfl:  .  Cetirizine HCl (ZYRTEC ALLERGY) 10 MG CAPS, Take 1 tablet by mouth daily., Disp: , Rfl:   Review of Systems  Constitutional: Negative for appetite change, chills, fatigue and fever.  HENT: Positive for congestion and ear pain. Negative for drooling, ear discharge, hoarse voice and trouble swallowing.   Respiratory: Negative for cough, chest tightness, shortness of breath and stridor.   Cardiovascular: Negative for chest pain and palpitations.  Gastrointestinal: Negative for abdominal pain, diarrhea, nausea and vomiting.  Musculoskeletal: Negative for neck pain.  Neurological: Positive for headaches. Negative for dizziness and weakness.    Social History   Tobacco Use  . Smoking status: Never  Smoker  . Smokeless tobacco: Never Used  Substance Use Topics  . Alcohol use: No   Objective:   BP 120/88 (BP Location: Right Arm, Patient Position: Sitting, Cuff Size: Normal)   Pulse 96   Temp 99.3 F (37.4 C) (Oral)   Resp 16   Wt 196 lb (88.9 kg)   SpO2 98%   BMI 30.70 kg/m  Vitals:   12/26/17 1339  BP: 120/88  Pulse: 96  Resp: 16  Temp: 99.3 F (37.4 C)  TempSrc: Oral  SpO2: 98%  Weight: 196 lb (88.9 kg)     Physical Exam  General Appearance:    Alert, cooperative, no distress  HENT:   bilateral TM normal without fluid or infection, neck has bilateral anterior cervical nodes enlarged (R>L), pharynx erythematous without exudate and sinuses nontender  Eyes:    PERRL, conjunctiva/corneas clear, EOM's intact       Lungs:     Clear to auscultation bilaterally, respirations unlabored  Heart:    Regular rate and rhythm  Neurologic:   Awake, alert, oriented x 3. No apparent focal neurological           defect.       Results for orders placed or performed in visit on 12/26/17  POCT rapid strep A  Result Value Ref Range   Rapid Strep A Screen Negative Negative        Assessment & Plan:     1. Sore throat Recommend frequent warm salt water gargles and OTC NSAIDs per lable - POCT rapid strep A  2. Upper respiratory  tract infection, unspecified type Counseled regarding signs and symptoms of viral and bacterial respiratory infections. Advised to call or return for additional evaluation if she develops any sign of bacterial infection, or if current symptoms last longer than 10 days.         Lelon Huh, MD  [facility]

## 2018-01-06 ENCOUNTER — Encounter: Payer: Self-pay | Admitting: Certified Nurse Midwife

## 2018-01-06 ENCOUNTER — Ambulatory Visit (INDEPENDENT_AMBULATORY_CARE_PROVIDER_SITE_OTHER): Payer: BLUE CROSS/BLUE SHIELD | Admitting: Certified Nurse Midwife

## 2018-01-06 VITALS — BP 122/74 | Ht 67.0 in | Wt 192.0 lb

## 2018-01-06 DIAGNOSIS — Z01419 Encounter for gynecological examination (general) (routine) without abnormal findings: Secondary | ICD-10-CM

## 2018-01-06 DIAGNOSIS — Z803 Family history of malignant neoplasm of breast: Secondary | ICD-10-CM | POA: Diagnosis not present

## 2018-01-06 DIAGNOSIS — Z124 Encounter for screening for malignant neoplasm of cervix: Secondary | ICD-10-CM | POA: Diagnosis not present

## 2018-01-06 DIAGNOSIS — Z1239 Encounter for other screening for malignant neoplasm of breast: Secondary | ICD-10-CM

## 2018-01-06 DIAGNOSIS — E049 Nontoxic goiter, unspecified: Secondary | ICD-10-CM

## 2018-01-06 DIAGNOSIS — R6889 Other general symptoms and signs: Secondary | ICD-10-CM | POA: Diagnosis not present

## 2018-01-06 DIAGNOSIS — Z1231 Encounter for screening mammogram for malignant neoplasm of breast: Secondary | ICD-10-CM | POA: Diagnosis not present

## 2018-01-06 NOTE — Progress Notes (Signed)
Gynecology Annual Exam  PCP: Margo Common, [place]  Chief Complaint:  Chief Complaint  Patient presents with  . Annual Exam    History of Present Illness:Amanda Coffey is a 42 year old Caucasian/White female , G 3 P 2 0 1 2 , who presents for her annual exam . She is having no significant GYN problems. She reports heat intolerance x 4-5 mos,  is hot all the time when others are cold. Her hot flashes are accompanied by feeling like she has to have a BM.   Her menses are regular and her LMP was 12/18/2017 . They occur every 28 days , they last 4 days , and are a moderate flow. Bleeding significantly improved since her 2016 D&C for menorrhagia and suspected uterine polyp.  Has an intermittent achy feeling in her lower abdomen. No associated GI or GU sx. She has had no intermenstrual bleeding.   The patient's past medical history is remarkable for a history of interstitial cystitis, shingles and VAIN 1  Since her last annual GYN exam dated 11/03/2016, she has had no other changes in her health.  She is sexually active. She is currently using a vasectomy for contraception.  Her most recent pap smear was obtained 11/03/2016 and was negative.  Her most recent mammogram obtained on 12/10/2016 was normal.  There is a positive history of breast cancer in her mother, paternal second cousins x 2, and paternal grandmother. Genetic testing has been done. The patient tested negative for BRCA 1, negative for BRCA 2, and negative for BART.  There is no family history of ovarian cancer.  The patient does not do monthly self breast exams.  The patient does not smoke.  The patient does drink infrequently.  The patient does not use illegal drugs.  The patient  started  exercising regularly, walking 2-4 miles, 5x/week. The patient does not get adequate calcium in her diet.  She has had a recent cholesterol screen in 2018 and her cholesterol was low. (Total cholesterol =90)    The patient denies  current symptoms of depression.    Review of Systems: Review of Systems  Constitutional: Negative for chills, fever and weight loss.  HENT: Negative for congestion, sinus pain and sore throat.   Eyes: Negative for blurred vision and pain.  Respiratory: Negative for hemoptysis, shortness of breath and wheezing.   Cardiovascular: Negative for chest pain, palpitations and leg swelling.  Gastrointestinal: Negative for abdominal pain, blood in stool, diarrhea, heartburn, nausea and vomiting.  Genitourinary: Negative for dysuria, frequency, hematuria and urgency.  Musculoskeletal: Negative for back pain, joint pain and myalgias.  Skin: Negative for itching and rash.  Neurological: Negative for dizziness, tingling and headaches.  Endo/Heme/Allergies: Negative for environmental allergies and polydipsia. Does not bruise/bleed easily.       Negative for hirsutism; positive for heat intolerance   Psychiatric/Behavioral: Negative for depression. The patient is not nervous/anxious and does not have insomnia.     Past Medical History:  Past Medical History:  Diagnosis Date  . Abnormal Pap smear of cervix 1999   LGSIL  . Family history of breast cancer    has tested negative for HBOC in 2013/ lifetime risk of breast cancer 31.3%  . GERD (gastroesophageal reflux disease)   . History of chicken pox   . Interstitial cystitis   . Shingles     Past Surgical History:  Past Surgical History:  Procedure Laterality Date  . COLPOSCOPY  1999  LGSIL Pap, mild dysplasia, VAIN 1  . DILATION AND CURETTAGE OF UTERUS  02-07-04   due to missed abortion  . HYSTEROSCOPY W/D&C N/A 06/27/2015   Procedure: DILATATION AND CURETTAGE /HYSTEROSCOPY;  Surgeon: Honor Loh Ward, MD;  Location: [facility] ORS;  Service: Gynecology;  Laterality: N/A;    Family History:  Family History  Problem Relation Age of Onset  . Hypertension Mother   . Breast cancer Mother 46  . Crohn's disease Father   . Scoliosis Sister   . Crohn's  disease Paternal Grandmother   . Breast cancer Paternal Grandmother 21  . CAD Paternal Grandfather        stent  . Breast cancer Cousin 42       died in 2003-02-06  . Colon cancer Paternal Uncle 30  . Breast cancer Cousin 55  . Hypertension Maternal Aunt   . Multiple myeloma Maternal Aunt     Social History:  Social History   Socioeconomic History  . Marital status: Married    Spouse name: Johnathan  . Number of children: 2  . Years of education: Not on file  . Highest education level: Not on file  Social Needs  . Financial resource strain: Not on file  . Food insecurity - worry: Not on file  . Food insecurity - inability: Not on file  . Transportation needs - medical: Not on file  . Transportation needs - non-medical: Not on file  Occupational History  . Occupation: Designer, television/film set  Tobacco Use  . Smoking status: Never Smoker  . Smokeless tobacco: Never Used  Substance and Sexual Activity  . Alcohol use: No  . Drug use: No  . Sexual activity: Yes    Partners: Male    Birth control/protection: Surgical    Comment: vasectomy  Other Topics Concern  . Not on file  Social History Narrative  . Not on file    Allergies:  No Known Allergies  Medications:  Current Outpatient Medications:  .  Calcium Carb-Cholecalciferol (CALCIUM 500 + D3 PO), Take 2 tablets by mouth daily. Reported on 04/20/2016, Disp: , Rfl:  .  Cetirizine HCl (ZYRTEC ALLERGY) 10 MG CAPS, Take 1 tablet by mouth daily., Disp: , Rfl:  Physical Exam Vitals: BP 122/74   Ht 5' 7"  (1.702 m)   Wt 192 lb (87.1 kg)   LMP 12/18/2017   BMI 30.07 kg/m   General: WF in NAD HEENT: normocephalic, anicteric Neck: prominent thyroid, no palpable nodules, no cervical lymphadenopathy  Pulmonary: No increased work of breathing, CTAB Cardiovascular: RRR, without murmur  Breast: Breast symmetrical, no tenderness, no palpable nodules or masses, no skin or nipple retraction present, no nipple discharge.  No axillary,  infraclavicular or supraclavicular lymphadenopathy. Abdomen: Soft, non-tender, non-distended.  Umbilicus without lesions.  No hepatomegaly or masses palpable. No evidence of hernia. Genitourinary:  External: Normal external female genitalia.  Normal urethral meatus, normal Bartholin's and Skene's glands.    Vagina: Normal vaginal mucosa, no evidence of prolapse.    Cervix: Grossly normal in appearance, no bleeding, non-tender  Uterus: Anteverted, normal size, shape, and consistency, mobile, and non-tender  Adnexa: No adnexal masses, non-tender  Rectal: deferred  Lymphatic: no evidence of inguinal lymphadenopathy Extremities: no edema, erythema, or tenderness Neurologic: Grossly intact Psychiatric: mood appropriate, affect full     Assessment: 42 y.o. G3P0010 annual gyn exam Heat intolerance and mild goiter  R/O hyperthyroidism-TSH and free T4 done. FSH and LH also ordered  Thyroid ultrasound ordered Family history of breast cancer  and increased risk of breast cancer   Plan:   1) Breast cancer screening - recommend monthly self breast exam and annual mammograms. Mammogram was ordered today. Discussed getting [place] update testing and she agrees  2) Cervical cancer screening - Pap was done.  3) Contraception-vasectomy  4) Routine healthcare maintenance including cholesterol and diabetes screening up to date   5) Follow up in 6 weeks for [facility] testing results.  Dalia Heading, CNM

## 2018-01-07 LAB — TSH+FREE T4
Free T4: 1.48 ng/dL (ref 0.82–1.77)
TSH: 0.449 u[IU]/mL — AB (ref 0.450–4.500)

## 2018-01-07 LAB — FSH/LH
FSH: 3.6 m[IU]/mL
LH: 3.3 m[IU]/mL

## 2018-01-10 ENCOUNTER — Ambulatory Visit: Payer: BLUE CROSS/BLUE SHIELD

## 2018-01-10 ENCOUNTER — Ambulatory Visit
Admission: RE | Admit: 2018-01-10 | Discharge: 2018-01-10 | Disposition: A | Discharge status: Home / Self Care | Payer: BLUE CROSS/BLUE SHIELD | Source: Ambulatory Visit | Attending: Certified Nurse Midwife | Admitting: Certified Nurse Midwife

## 2018-01-10 DIAGNOSIS — E049 Nontoxic goiter, unspecified: Secondary | ICD-10-CM | POA: Insufficient documentation

## 2018-01-11 ENCOUNTER — Encounter: Payer: Self-pay | Admitting: Certified Nurse Midwife

## 2018-01-11 LAB — IGP, APTIMA HPV
HPV APTIMA: NEGATIVE
PAP Smear Comment: 0

## 2018-01-12 ENCOUNTER — Other Ambulatory Visit: Payer: Self-pay | Admitting: Certified Nurse Midwife

## 2018-01-12 DIAGNOSIS — Z803 Family history of malignant neoplasm of breast: Secondary | ICD-10-CM | POA: Insufficient documentation

## 2018-01-12 DIAGNOSIS — E049 Nontoxic goiter, unspecified: Secondary | ICD-10-CM

## 2018-01-12 DIAGNOSIS — R7989 Other specified abnormal findings of blood chemistry: Secondary | ICD-10-CM

## 2018-01-12 NOTE — Progress Notes (Signed)
PAtient called and advised of lab results, Pap smear results and thyroid ultrasound. May have some subclinical hyperthyroidism. TSH borderline low with a normal free T4. TSI and free T3 added to labs. Will refer to endocrinology for further evaluation.

## 2018-01-14 LAB — SPECIMEN STATUS REPORT

## 2018-01-14 LAB — T3, FREE: T3, Free: 3.2 pg/mL (ref 2.0–4.4)

## 2018-01-14 LAB — THYROID STIMULATING IMMUNOGLOBULIN: Thyroid Stim Immunoglobulin: <0.1 IU/L (ref 0.00–0.55)

## 2018-01-17 ENCOUNTER — Encounter: Payer: Self-pay | Admitting: Obstetrics and Gynecology

## 2018-01-30 ENCOUNTER — Ambulatory Visit
Admission: RE | Admit: 2018-01-30 | Discharge: 2018-01-30 | Disposition: A | Discharge status: Home / Self Care | Payer: BLUE CROSS/BLUE SHIELD | Source: Ambulatory Visit | Attending: Certified Nurse Midwife | Admitting: Certified Nurse Midwife

## 2018-01-30 DIAGNOSIS — Z1231 Encounter for screening mammogram for malignant neoplasm of breast: Secondary | ICD-10-CM | POA: Insufficient documentation

## 2018-01-30 DIAGNOSIS — Z1239 Encounter for other screening for malignant neoplasm of breast: Secondary | ICD-10-CM

## 2018-01-30 DIAGNOSIS — Z803 Family history of malignant neoplasm of breast: Secondary | ICD-10-CM

## 2018-02-10 ENCOUNTER — Encounter: Payer: Self-pay | Admitting: Certified Nurse Midwife

## 2018-02-13 ENCOUNTER — Telehealth: Payer: Self-pay

## 2018-02-13 NOTE — Telephone Encounter (Signed)
Pt aware results negative and plans to contact office for appt if any further questions.

## 2018-02-13 NOTE — Telephone Encounter (Signed)
Pt calling for genetic results from 01/06/18.  [phone]

## 2018-02-17 ENCOUNTER — Telehealth: Payer: Self-pay | Admitting: Certified Nurse Midwife

## 2018-02-17 NOTE — Telephone Encounter (Signed)
Patient called to discuss her MYRISK test results. Her MYRISK test was negative, but her lifetime risk of breast cancer was 24.6%. Recommended annual mammograms and breast MRIs. Also offered q6 mos breast exams. Encouraged her to do monthly SBE. She will let me know if she desires a referral for a breast MRI. Her recent mammogram was negative. Will send a copy of the [place] results. Is being followed by Dr Carmela Rima for her goiter and thyroiditis.

## 2018-06-07 ENCOUNTER — Encounter: Payer: Self-pay | Admitting: Certified Nurse Midwife

## 2018-06-07 DIAGNOSIS — E040 Nontoxic diffuse goiter: Secondary | ICD-10-CM | POA: Insufficient documentation

## 2018-09-28 NOTE — Progress Notes (Signed)
[facility] on 09/26/18

## 2019-08-31 ENCOUNTER — Ambulatory Visit: Payer: BLUE CROSS/BLUE SHIELD | Admitting: Certified Nurse Midwife

## 2019-09-20 NOTE — Progress Notes (Signed)
Gynecology Annual Exam  PCP: Margo Common, [place]  Chief Complaint:  Chief Complaint  Patient presents with  . Gynecologic Exam    History of Present Illness:Amanda Coffey is a 43 year old Caucasian/White female , G 3 P 2 0 1 2 , who presents for her annual exam . She is having no significant GYN problems. Reports that her menses are more frequent.and her LMP was 09/03/2019. They occur every 21-22 days , they last 2-5 days , and are a light to moderate flow. Bleeding significantly improved since her 2016 D&C for menorrhagia and suspected uterine polyp. She denies dysmenorrhea. She has had no intermenstrual bleeding.   The patient's past medical history is remarkable for a history of interstitial cystitis, a goiter, shingles and VAIN 1  Since her last annual GYN exam dated 01/06/2018, she has had no other changes in her health. 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 She is sexually active. She is currently using a vasectomy for contraception.  Her most recent pap smear was obtained 01/06/2018 and was NIL/negative HRHPV  Her most recent mammogram obtained on 01/30/2018 was normal.  There is a positive history of breast cancer in her mother, paternal second cousins x 2, and paternal grandmother. Genetic testing has been done. The patient tested negative for BRCA 1, negative for BRCA 2, and negative for BART and last year she had a negative Myrisk update test. Her lifetime risk of breast cancer is 24.6%.  There is no family history of ovarian cancer.  The patient does  do occasional self breast exams.  The patient does not smoke.  The patient does drink infrequently.  The patient does not use illegal drugs.  The patient  Reports exercising by getting 10, 000 steps a day. The patient does not get adequate calcium in her diet.  She has had a recent cholesterol screen in 2018 and her cholesterol was low. (Total cholesterol =90)    The patient denies current symptoms of  depression.    Review of Systems: Review of Systems  Constitutional: Negative for chills, fever and weight loss.  HENT: Negative for congestion, sinus pain and sore throat.   Eyes: Negative for blurred vision and pain.  Respiratory: Negative for hemoptysis, shortness of breath and wheezing.   Cardiovascular: Negative for chest pain, palpitations and leg swelling.  Gastrointestinal: Negative for abdominal pain, blood in stool, diarrhea, heartburn, nausea and vomiting.  Genitourinary: Negative for dysuria, frequency, hematuria and urgency.  Musculoskeletal: Positive for joint pain (left knee). Negative for back pain and myalgias.  Skin: Negative for itching and rash.  Neurological: Positive for headaches. Negative for dizziness and tingling.  Endo/Heme/Allergies: Positive for environmental allergies. Negative for polydipsia. Does not bruise/bleed easily.       Negative for hirsutism; positive for heat intolerance   Psychiatric/Behavioral: Negative for depression. The patient is not nervous/anxious and does not have insomnia.     Past Medical History:  Past Medical History:  Diagnosis Date  . Abnormal Pap smear of cervix 1999   LGSIL  . BRCA negative 2013; 2/19   BRCA neg; MyRisk neg  . Family history of breast cancer    has tested negative for HBOC in 2013/ negative Myrisk test 2019/ lifetime risk 24.6%  . GERD (gastroesophageal reflux disease)   . Goiter   . History of chicken pox   . Increased risk of breast cancer    IBIS=25%, riskscore=24.6%  . Interstitial cystitis   .  Shingles     Past Surgical History:  Past Surgical History:  Procedure Laterality Date  . COLPOSCOPY  1999   LGSIL Pap, mild dysplasia, VAIN 1  . DILATION AND CURETTAGE OF UTERUS  2003/12/31   due to missed abortion  . HYSTEROSCOPY W/D&C N/A 06/27/2015   Procedure: DILATATION AND CURETTAGE /HYSTEROSCOPY;  Surgeon: Honor Loh Ward, MD;  Location: [facility] ORS;  Service: Gynecology;  Laterality: N/A;    Family  History:  Family History  Problem Relation Age of Onset  . Hypertension Mother   . Breast cancer Mother 50  . Crohn's disease Father   . Scoliosis Sister   . Crohn's disease Paternal Grandmother   . Breast cancer Paternal Grandmother 38  . CAD Paternal Grandfather        stent  . Breast cancer Cousin 76       2nd cousin paternal side. died in 2002-12-30  . Colon cancer Paternal Uncle 71  . Breast cancer Cousin 77       2nd cousin on paternal side  . Hypertension Maternal Aunt   . Multiple myeloma Maternal Aunt     Social History:  Social History   Socioeconomic History  . Marital status: Married    Spouse name: Johnathan  . Number of children: 2  . Years of education: Not on file  . Highest education level: Not on file  Occupational History  . Occupation: Designer, television/film set  Social Needs  . Financial resource strain: Not on file  . Food insecurity    Worry: Not on file    Inability: Not on file  . Transportation needs    Medical: Not on file    Non-medical: Not on file  Tobacco Use  . Smoking status: Never Smoker  . Smokeless tobacco: Never Used  Substance and Sexual Activity  . Alcohol use: No  . Drug use: No  . Sexual activity: Yes    Partners: Male    Birth control/protection: Surgical    Comment: vasectomy  Lifestyle  . Physical activity    Days per week: Not on file    Minutes per session: Not on file  . Stress: Not on file  Relationships  . Social Herbalist on phone: Not on file    Gets together: Not on file    Attends religious service: Not on file    Active member of club or organization: Not on file    Attends meetings of clubs or organizations: Not on file    Relationship status: Not on file  . Intimate partner violence    Fear of current or ex partner: Not on file    Emotionally abused: Not on file    Physically abused: Not on file    Forced sexual activity: Not on file  Other Topics Concern  . Not on file  Social History Narrative   . Not on file    Allergies:  No Known Allergies  Medications:  Current Outpatient Medications:  .  Cetirizine HCl (ZYRTEC ALLERGY) 10 MG CAPS, Take 1 tablet by mouth daily., Disp: , Rfl:  .  Multiple Vitamins-Minerals (MULTIVITAMIN WITH MINERALS) tablet, Take 1 tablet by mouth daily., Disp: , Rfl:   Physical Exam Vitals: BP 130/80   Pulse (!) 102   Ht _0  (1.702 m)   Wt 200 lb (90.7 kg)   LMP 09/03/2019 (Approximate)   BMI 31.32 kg/m   General: WF in NAD HEENT: normocephalic, anicteric Neck: prominent thyroid, no palpable  nodules, no cervical lymphadenopathy  Pulmonary: No increased work of breathing, CTAB Cardiovascular: RRR, without murmur  Breast: Breast symmetrical, no tenderness, no palpable nodules or masses, no skin or nipple retraction present, no nipple discharge.  No axillary, infraclavicular or supraclavicular lymphadenopathy. Abdomen: Soft, non-tender, non-distended.  Umbilicus without lesions.  No hepatomegaly or masses palpable. No evidence of hernia. Genitourinary:  External: Normal external female genitalia.  Normal urethral meatus, normal Bartholin's and Skene's glands.    Vagina: Normal vaginal mucosa, no evidence of prolapse.    Cervix: Grossly normal in appearance, no bleeding, non-tender  Uterus: Anteverted, normal size, shape, and consistency, mobile, and non-tender  Adnexa: No adnexal masses, non-tender  Rectal: deferred  Lymphatic: no evidence of inguinal lymphadenopathy Extremities: no edema, erythema, or tenderness Neurologic: Grossly intact Psychiatric: mood appropriate, affect full     Assessment: 43 y.o. [id number] annual gyn exam Family history of breast cancer and increased risk of breast cancer Frequent menses. -discussed some options to control bleeding like Mirena IUD, Depo, OCPS.  Patient declines at this time.   Plan:   1) Breast cancer screening - recommend monthly self breast exam and annual mammograms. Mammogram was ordered  today. Discussed getting annual breast MRIS-given codes so she can see if covered by insurance  2) Cervical cancer screening - Pap was not done. Desires Pap every 3 years  3) Contraception-vasectomy  4) Routine healthcare maintenance including cholesterol and diabetes screening up to date   99) Discussed options for colon cancer screening next year.  6) RTN in 1 year for annual exam  Dalia Heading, CNM

## 2019-09-21 ENCOUNTER — Encounter: Payer: Self-pay | Admitting: Certified Nurse Midwife

## 2019-09-21 ENCOUNTER — Other Ambulatory Visit: Payer: Self-pay

## 2019-09-21 ENCOUNTER — Ambulatory Visit (INDEPENDENT_AMBULATORY_CARE_PROVIDER_SITE_OTHER): Payer: BC Managed Care – PPO | Admitting: Certified Nurse Midwife

## 2019-09-21 VITALS — BP 130/80 | HR 102 | Ht 67.0 in | Wt 200.0 lb

## 2019-09-21 DIAGNOSIS — Z1231 Encounter for screening mammogram for malignant neoplasm of breast: Secondary | ICD-10-CM

## 2019-09-21 DIAGNOSIS — Z01419 Encounter for gynecological examination (general) (routine) without abnormal findings: Secondary | ICD-10-CM | POA: Diagnosis not present

## 2019-09-21 DIAGNOSIS — Z9189 Other specified personal risk factors, not elsewhere classified: Secondary | ICD-10-CM

## 2019-09-21 DIAGNOSIS — Z803 Family history of malignant neoplasm of breast: Secondary | ICD-10-CM

## 2019-09-22 ENCOUNTER — Encounter: Payer: Self-pay | Admitting: Certified Nurse Midwife

## 2019-09-22 DIAGNOSIS — Z9189 Other specified personal risk factors, not elsewhere classified: Secondary | ICD-10-CM | POA: Insufficient documentation

## 2019-10-24 ENCOUNTER — Ambulatory Visit
Admission: RE | Admit: 2019-10-24 | Discharge: 2019-10-24 | Disposition: A | Discharge status: Home / Self Care | Payer: BC Managed Care – PPO | Source: Ambulatory Visit | Attending: Certified Nurse Midwife | Admitting: Certified Nurse Midwife

## 2019-10-24 DIAGNOSIS — Z803 Family history of malignant neoplasm of breast: Secondary | ICD-10-CM | POA: Diagnosis present

## 2019-10-24 DIAGNOSIS — Z9189 Other specified personal risk factors, not elsewhere classified: Secondary | ICD-10-CM | POA: Diagnosis present

## 2019-10-24 DIAGNOSIS — Z1231 Encounter for screening mammogram for malignant neoplasm of breast: Secondary | ICD-10-CM | POA: Diagnosis present

## 2019-12-13 ENCOUNTER — Ambulatory Visit: Payer: BC Managed Care – PPO | Attending: Internal Medicine

## 2019-12-13 DIAGNOSIS — Z20822 Contact with and (suspected) exposure to covid-19: Secondary | ICD-10-CM

## 2019-12-14 LAB — NOVEL CORONAVIRUS, NAA: SARS-CoV-2, NAA: NOT DETECTED

## 2020-09-22 ENCOUNTER — Ambulatory Visit: Payer: BC Managed Care – PPO | Admitting: Obstetrics and Gynecology

## 2020-10-22 DIAGNOSIS — Z1211 Encounter for screening for malignant neoplasm of colon: Secondary | ICD-10-CM

## 2020-10-22 HISTORY — DX: Encounter for screening for malignant neoplasm of colon: Z12.11

## 2020-11-02 NOTE — Progress Notes (Signed)
PCP: Amanda Common, PA-C   Chief Complaint  Patient presents with  . Gynecologic Exam    Pt says for the past yr she feels hot all the time  . Injections    flu    HPI:      [name] is a 44 y.o. [id number] whose LMP was Patient's last menstrual period was 10/12/2020 (exact date)., presents today for her annual examination.  Her menses are regular every 21-24 days, lasting 4-5 days, mod flow.  Dysmenorrhea none. She does not have intermenstrual bleeding. Does have diarrhea a couple days before her period starts. C/o feeling hot all the time, particularly in neck/head. Sometimes with night sweats. Sx for 1-1 1/2 yrs. Sees endocrine for thyroid nodules with normal labs. No thyroid tx needed. Interested in trying hormones to decrease bleeding frequency and improve feeling hot. No hx of HTN, DVTs, migraines with aura. Did OCPs in past without side effects.   Sex activity: single partner, contraception - vasectomy. She does not have vaginal dryness.  Last Pap: 01/06/18  Results were: no abnormalities /neg HPV DNA. Due Q3 yrs.  Hx of STDs: HPV--VAIN 1 and LGSIL  Last mammogram: 10/24/19  Results were: normal--routine follow-up in 12 months There is FH of breast cancer in her mother, paternal second cousins x 2, and paternal grandmother. There is no FH of ovarian cancer. Pt is MyRisk neg Jan 22, 2018; IBIS=25%, riskscore=24.6%. The patient does not do self-breast exams.  Colonoscopy: never  Tobacco use: The patient denies current or previous tobacco use. Alcohol use: none  No drug use Exercise: not active  She does not get adequate calcium and Vitamin D in her diet.  Labs with endocrine.   Past Medical History:  Diagnosis Date  . Abnormal Pap smear of cervix 1999   LGSIL  . BRCA negative 2012-01-23; 2/19   BRCA neg; MyRisk neg  . Family history of breast cancer    has tested negative for HBOC in 01/23/2012 negative Myrisk test 2018-01-22 lifetime risk 24.6%  . GERD (gastroesophageal reflux  disease)   . Goiter   . History of chicken pox   . Increased risk of breast cancer    IBIS=25%, riskscore=24.6%  . Interstitial cystitis   . Shingles     Past Surgical History:  Procedure Laterality Date  . COLPOSCOPY  1999   LGSIL Pap, mild dysplasia, VAIN 1  . DILATION AND CURETTAGE OF UTERUS  01-23-04   due to missed abortion  . HYSTEROSCOPY WITH D & C N/A 06/27/2015   Procedure: DILATATION AND CURETTAGE /HYSTEROSCOPY;  Surgeon: Honor Loh Ward, MD;  Location: [facility] ORS;  Service: Gynecology;  Laterality: N/A;    Family History  Problem Relation Age of Onset  . Hypertension Mother   . Breast cancer Mother 20  . Crohn's disease Father   . Scoliosis Sister   . Crohn's disease Paternal Grandmother   . Breast cancer Paternal Grandmother 68  . CAD Paternal Grandfather        stent  . Breast cancer Cousin 27       2nd cousin paternal side. died in 01/22/2003  . Colon cancer Paternal Uncle 75  . Breast cancer Cousin 62       2nd cousin on paternal side  . Hypertension Maternal Aunt   . Multiple myeloma Maternal Aunt     Social History   Socioeconomic History  . Marital status: Married    Spouse name: Johnathan  . Number of children: 2  .  Years of education: Not on file  . Highest education level: Not on file  Occupational History  . Occupation: Designer, television/film set  Tobacco Use  . Smoking status: Never Smoker  . Smokeless tobacco: Never Used  Vaping Use  . Vaping Use: Never used  Substance and Sexual Activity  . Alcohol use: No  . Drug use: No  . Sexual activity: Yes    Partners: Male    Birth control/protection: Surgical    Comment: vasectomy  Other Topics Concern  . Not on file  Social History Narrative  . Not on file   Social Determinants of Health   Financial Resource Strain: Not on file  Food Insecurity: Not on file  Transportation Needs: Not on file  Physical Activity: Not on file  Stress: Not on file  Social Connections: Not on file  Intimate Partner  Violence: Not on file     Current Outpatient Medications:  .  Cetirizine HCl 10 MG CAPS, Take 1 tablet by mouth daily., Disp: , Rfl:  .  Multiple Vitamins-Minerals (MULTIVITAMIN WITH MINERALS) tablet, Take 1 tablet by mouth daily., Disp: , Rfl:  .  Levonorgestrel-Ethinyl Estradiol (AMETHIA) 0.15-0.03 &0.01 MG tablet, Take 1 tablet by mouth daily., Disp: 91 tablet, Rfl: 3     ROS:  Review of Systems  Constitutional: Positive for fatigue. Negative for fever and unexpected weight change.  Respiratory: Negative for cough, shortness of breath and wheezing.   Cardiovascular: Negative for chest pain, palpitations and leg swelling.  Gastrointestinal: Negative for blood in stool, constipation, diarrhea, nausea and vomiting.  Endocrine: Positive for heat intolerance. Negative for cold intolerance and polyuria.  Genitourinary: Negative for dyspareunia, dysuria, flank pain, frequency, genital sores, hematuria, menstrual problem, pelvic pain, urgency, vaginal bleeding, vaginal discharge and vaginal pain.  Musculoskeletal: Negative for back pain, joint swelling and myalgias.  Skin: Negative for rash.  Neurological: Positive for headaches. Negative for dizziness, syncope, light-headedness and numbness.  Hematological: Negative for adenopathy.  Psychiatric/Behavioral: Negative for agitation, confusion, sleep disturbance and suicidal ideas. The patient is not nervous/anxious.    BREAST: No symptoms    Objective: BP 130/80   Ht _0  (1.702 m)   Wt 198 lb (89.8 kg)   LMP 10/12/2020 (Exact Date)   BMI 31.01 kg/m    Physical Exam Constitutional:      Appearance: She is well-developed.  Genitourinary:     Vulva, vagina, cervix, uterus, right adnexa and left adnexa normal.     No labial lesion or tenderness noted.     No vaginal discharge, erythema or tenderness.      Right Adnexa: not tender and no mass present.    Left Adnexa: not tender and no mass present.    No cervical polyp.      Uterus is not enlarged or tender.  Breasts:     Right: No mass, nipple discharge, skin change or tenderness.     Left: No mass, nipple discharge, skin change or tenderness.    Neck:     Thyroid: No thyromegaly.  Cardiovascular:     Rate and Rhythm: Normal rate and regular rhythm.     Heart sounds: Normal heart sounds. No murmur heard.   Pulmonary:     Effort: Pulmonary effort is normal.     Breath sounds: Normal breath sounds.  Abdominal:     Palpations: Abdomen is soft.     Tenderness: There is no abdominal tenderness. There is no guarding.  Musculoskeletal:  General: Normal range of motion.     Cervical back: Normal range of motion.  Neurological:     General: No focal deficit present.     Mental Status: She is alert and oriented to person, place, and time.     Cranial Nerves: No cranial nerve deficit.  Skin:    General: Skin is warm and dry.  Psychiatric:        Mood and Affect: Mood normal.        Behavior: Behavior normal.        Thought Content: Thought content normal.        Judgment: Judgment normal.  Vitals reviewed.     Assessment/Plan:  Encounter for annual routine gynecological examination  Encounter for initial prescription of contraceptive pills - Plan: Levonorgestrel-Ethinyl Estradiol (AMETHIA) 0.15-0.03 &0.01 MG tablet; OCP start with next menses for cycle control and vasomotor sx. Do 3 mo OCP vs continuous dosing to never have menses. Rx eRxd. F/u prn.   Heat intolerance--normal thyroid labs with endocrine. Try OCPs. If no sx change, pt may want IUD for menses. F/u prn.   Encounter for screening mammogram for malignant neoplasm of breast - Plan: MM 3D SCREEN BREAST BILATERAL; pt to sched mammo  Family history of breast cancer - Plan: MM 3D SCREEN BREAST BILATERAL  Increased risk of breast cancer - Plan: MM 3D SCREEN BREAST BILATERAL; recommended monthly SBE, yearly CBE and mammos; also scr breast MRI. Pt to call for ref prn. Add Vit D  supp.  Screening for colon cancer - Plan: Cologuard; colonoscopy/cologuard discussed. Pt elects cologuard. Ref sent. Will f/u with results.   Meds ordered this encounter  Medications  . Levonorgestrel-Ethinyl Estradiol (AMETHIA) 0.15-0.03 &0.01 MG tablet    Sig: Take 1 tablet by mouth daily.    Dispense:  91 tablet    Refill:  3    Order Specific Question:   Supervising Provider    Answer:   Gae Dry [[id number]]            GYN counsel breast self exam, mammography screening, use and side effects of OCP's, adequate intake of calcium and vitamin D, diet and exercise    F/U  Return in about 1 year (around 11/03/2021).  Kingslee Dowse B. Darly Massi, PA-C 11/03/2020 2:25 PM

## 2020-11-03 ENCOUNTER — Other Ambulatory Visit: Payer: Self-pay

## 2020-11-03 ENCOUNTER — Ambulatory Visit (INDEPENDENT_AMBULATORY_CARE_PROVIDER_SITE_OTHER): Payer: BC Managed Care – PPO | Admitting: Obstetrics and Gynecology

## 2020-11-03 ENCOUNTER — Encounter: Payer: Self-pay | Admitting: Obstetrics and Gynecology

## 2020-11-03 VITALS — BP 130/80 | Ht 67.0 in | Wt 198.0 lb

## 2020-11-03 DIAGNOSIS — Z1211 Encounter for screening for malignant neoplasm of colon: Secondary | ICD-10-CM

## 2020-11-03 DIAGNOSIS — Z1231 Encounter for screening mammogram for malignant neoplasm of breast: Secondary | ICD-10-CM

## 2020-11-03 DIAGNOSIS — Z9189 Other specified personal risk factors, not elsewhere classified: Secondary | ICD-10-CM

## 2020-11-03 DIAGNOSIS — R6889 Other general symptoms and signs: Secondary | ICD-10-CM | POA: Insufficient documentation

## 2020-11-03 DIAGNOSIS — Z30011 Encounter for initial prescription of contraceptive pills: Secondary | ICD-10-CM | POA: Diagnosis not present

## 2020-11-03 DIAGNOSIS — Z23 Encounter for immunization: Secondary | ICD-10-CM

## 2020-11-03 DIAGNOSIS — Z01419 Encounter for gynecological examination (general) (routine) without abnormal findings: Secondary | ICD-10-CM

## 2020-11-03 DIAGNOSIS — Z803 Family history of malignant neoplasm of breast: Secondary | ICD-10-CM

## 2020-11-03 MED ORDER — LEVONORGEST-ETH ESTRAD 91-DAY 0.15-0.03 &0.01 MG PO TABS: 1.0000 | ORAL_TABLET | Freq: Every day | ORAL | 3 refills | Status: DC

## 2020-11-03 NOTE — Patient Instructions (Signed)
I value your feedback and entrusting [place] with your care. If you get a [facility] patient survey, I would appreciate you taking the time to let us know about your experience today. Thank you!  As of November 01, 2019, your lab results will be released to your MyChart immediately, before I even have a chance to see them. Please give me time to review them and contact you if there are any abnormalities. Thank you for your patience.   [place] at [facility]: [phone]

## 2020-11-17 LAB — COLOGUARD: Cologuard: NEGATIVE

## 2020-11-26 LAB — EXTERNAL GENERIC LAB PROCEDURE: COLOGUARD: NEGATIVE

## 2020-11-26 LAB — COLOGUARD: COLOGUARD: NEGATIVE

## 2020-11-27 ENCOUNTER — Encounter: Payer: Self-pay | Admitting: Obstetrics and Gynecology

## 2020-12-05 ENCOUNTER — Ambulatory Visit
Admission: RE | Admit: 2020-12-05 | Discharge: 2020-12-05 | Disposition: A | Discharge status: Home / Self Care | Payer: BC Managed Care – PPO | Source: Ambulatory Visit | Attending: Obstetrics and Gynecology | Admitting: Obstetrics and Gynecology

## 2020-12-05 ENCOUNTER — Other Ambulatory Visit: Payer: Self-pay

## 2020-12-05 DIAGNOSIS — Z803 Family history of malignant neoplasm of breast: Secondary | ICD-10-CM | POA: Diagnosis present

## 2020-12-05 DIAGNOSIS — Z1231 Encounter for screening mammogram for malignant neoplasm of breast: Secondary | ICD-10-CM | POA: Diagnosis present

## 2020-12-05 DIAGNOSIS — Z9189 Other specified personal risk factors, not elsewhere classified: Secondary | ICD-10-CM | POA: Diagnosis present

## 2020-12-17 ENCOUNTER — Encounter: Payer: Self-pay | Admitting: Adult Health

## 2020-12-17 ENCOUNTER — Telehealth (INDEPENDENT_AMBULATORY_CARE_PROVIDER_SITE_OTHER): Payer: BLUE CROSS/BLUE SHIELD | Admitting: Adult Health

## 2020-12-17 VITALS — Temp 98.4°F | Wt 198.0 lb

## 2020-12-17 DIAGNOSIS — J0140 Acute pansinusitis, unspecified: Secondary | ICD-10-CM | POA: Insufficient documentation

## 2020-12-17 DIAGNOSIS — R238 Other skin changes: Secondary | ICD-10-CM | POA: Insufficient documentation

## 2020-12-17 DIAGNOSIS — Z8619 Personal history of other infectious and parasitic diseases: Secondary | ICD-10-CM | POA: Insufficient documentation

## 2020-12-17 DIAGNOSIS — B029 Zoster without complications: Secondary | ICD-10-CM

## 2020-12-17 DIAGNOSIS — Z8616 Personal history of COVID-19: Secondary | ICD-10-CM | POA: Insufficient documentation

## 2020-12-17 MED ORDER — FAMCICLOVIR 500 MG PO TABS: 500.0000 mg | ORAL_TABLET | Freq: Three times a day (TID) | ORAL | 0 refills | Status: DC

## 2020-12-17 MED ORDER — PREDNISONE 10 MG (21) PO TBPK: ORAL_TABLET | ORAL | 0 refills | Status: DC

## 2020-12-17 MED ORDER — AMOXICILLIN-POT CLAVULANATE 875-125 MG PO TABS: 1.0000 | ORAL_TABLET | Freq: Two times a day (BID) | ORAL | 0 refills | Status: DC

## 2020-12-17 NOTE — Progress Notes (Signed)
MyChart Video Visit    Virtual Visit via Video Note   This visit type was conducted due to national recommendations for restrictions regarding the COVID-19 Pandemic (e.g. social distancing) in an effort to limit this patient's exposure and mitigate transmission in our community. This patient is at least at moderate risk for complications without adequate follow up. This format is felt to be most appropriate for this patient at this time. Physical exam was limited by quality of the video and audio technology used for the visit.  Parties involved in visit as below:   Patient location: at home  Provider location: Provider: Provider's office at  [facility], [place].     I discussed the limitations of evaluation and management by telemedicine and the availability of in person appointments. The patient expressed understanding and agreed to proceed.  Patient: Amanda Coffey   DOB: August 16, 1976   45 y.o. Female  MRN: [id number] Visit Date: 12/17/2020  Today's healthcare provider: Marcille Buffy, FNP   Chief Complaint  Patient presents with  . Sinus Problem  . Skin sensitivity   Subjective    Sinus Problem This is a new problem. The current episode started more than 1 month ago (11/17/20). The problem is unchanged. There has been no fever. Associated symptoms include congestion, coughing, headaches and swollen glands (left side). Pertinent negatives include no chills, diaphoresis, ear pain, hoarse voice, neck pain, shortness of breath, sinus pressure, sneezing or sore throat. Treatments tried: prednisone. The treatment provided mild relief.   HPI    Patient reports for the past 24hrs sensitivity to the touch around her shoulders that is radiating to her back, patient describes this as a sore feeling and states that she has concerns of shingles outbreak. Patient reports that she has had shingles before and states that symptoms are similar to before, she denies  any rash visible on her body.    Last edited by Minette Headland, CMA on 12/17/2020  9:46 AM. (History)     She has had shingles three timed in past she reports.  She has no rash. She denies any burning.  Denies any erythema.  She has no blisters. This is similar to what she had previously.   No LMP recorded.  11/03/2021 she is on OCP and husband has vasectomy.   She tested positive December 29 th for covid. She denies any shortness of  breath.  She has sinus headaches, sinus congestions, green in color.  Has tried Dayquil and alka seltzer cold and cough.  She did take prednisone and it seemed to help she quit taking it but could not sleep.   Patient  denies any fever, body aches,chills, rash, chest pain, shortness of breath, nausea, vomiting, or diarrhea.  Denies dizziness, lightheadedness, pre syncopal or syncopal episodes.     Medications: Outpatient Medications Prior to Visit  Medication Sig  . Cetirizine HCl 10 MG CAPS Take 1 tablet by mouth daily.  . Levonorgestrel-Ethinyl Estradiol (AMETHIA) 0.15-0.03 &0.01 MG tablet Take 1 tablet by mouth daily.  . Multiple Vitamins-Minerals (MULTIVITAMIN WITH MINERALS) tablet Take 1 tablet by mouth daily.   No facility-administered medications prior to visit.    Review of Systems  Constitutional: Negative for chills and diaphoresis.  HENT: Positive for congestion. Negative for ear pain, hoarse voice, sinus pressure, sneezing and sore throat.   Respiratory: Positive for cough. Negative for shortness of breath.   Musculoskeletal: Negative for neck pain.  Neurological: Positive for headaches.  Last CBC Lab Results  Component Value Date   WBC 10.0 12/27/2016   HGB 14.0 12/27/2016   HCT 41.4 12/27/2016   MCV 90 12/27/2016   MCH 30.3 12/27/2016   RDW 13.1 12/27/2016   PLT 370 40/34/7425   Last metabolic panel Lab Results  Component Value Date   GLUCOSE 100 (H) 12/27/2016   NA 139 12/27/2016   K 4.3 12/27/2016   CL 100  12/27/2016   CO2 25 12/27/2016   BUN 13 12/27/2016   CREATININE 0.72 12/27/2016   GFRNONAA 104 12/27/2016   GFRAA 120 12/27/2016   CALCIUM 9.7 12/27/2016   PROT 7.1 12/27/2016   ALBUMIN 4.4 12/27/2016   LABGLOB 2.7 12/27/2016   AGRATIO 1.6 12/27/2016   BILITOT 0.2 12/27/2016   ALKPHOS 69 12/27/2016   AST 9 12/27/2016   ALT 10 12/27/2016   ANIONGAP 7 06/19/2015      Objective    Temp 98.4 F (36.9 C) (Oral)   Wt 198 lb (89.8 kg)   BMI 31.01 kg/m  BP Readings from Last 3 Encounters:  11/03/20 130/80  09/21/19 130/80  01/06/18 122/74   Wt Readings from Last 3 Encounters:  12/17/20 198 lb (89.8 kg)  11/03/20 198 lb (89.8 kg)  09/21/19 200 lb (90.7 kg)      Physical Exam    Patient is alert and oriented and responsive to questions Engages in conversation with provider. Speaks in full sentences without any pauses without any shortness of breath or distress.    Assessment & Plan     Acute non-recurrent pansinusitis  History of shingles - Plan: famciclovir (FAMVIR) 500 MG tablet  Skin sensitivity  History of COVID-19  early Herpes zoster without complication- suspected on telemedicine   Meds ordered this encounter  Medications  . amoxicillin-clavulanate (AUGMENTIN) 875-125 MG tablet    Sig: Take 1 tablet by mouth 2 (two) times daily.    Dispense:  20 tablet    Refill:  0  . predniSONE (STERAPRED UNI-PAK 21 TAB) 10 MG (21) TBPK tablet    Sig: PO: Take 6 tablets on day 1:Take 5 tablets day 2:Take 4 tablets day 3: Take 3 tablets day 4:Take 2 tablets day five: 5 Take 1 tablet day 6    Dispense:  21 tablet    Refill:  0  . famciclovir (FAMVIR) 500 MG tablet    Sig: Take 1 tablet (500 mg total) by mouth 3 (three) times daily.    Dispense:  21 tablet    Refill:  0   Return in about 1 week (around 12/24/2020), or if symptoms worsen or fail to improve, for at any time for any worsening symptoms.      Red Flags discussed. The patient was given clear instructions  to go to ER or return to medical center if any red flags develop, symptoms do not improve, worsen or new problems develop. They verbalized understanding.   I discussed the limitations of evaluation and management by telemedicine and the availability of in person appointments. The patient expressed understanding and agreed to proceed.   I discussed the assessment and treatment plan with the patient. The patient was provided an opportunity to ask questions and all were answered. The patient agreed with the plan and demonstrated an understanding of the instructions.   The patient was advised to call back or seek an in-person evaluation if the symptoms worsen or if the condition fails to improve as anticipated.  I provided 30 minutes of non-face-to-face time  during this encounter.  The entirety of the information documented in the History of Present Illness, Review of Systems and Physical Exam were personally obtained by me. Portions of this information were initially documented by the CMA and reviewed by me for thoroughness and accuracy.      Marcille Buffy, [place] [phone] (phone) [phone] (fax)  [place]

## 2020-12-17 NOTE — Patient Instructions (Signed)
Prednisolone tablets What is this medicine? PREDNISOLONE (pred NISS oh lone) is a corticosteroid. It is commonly used to treat inflammation of the skin, joints, lungs, and other organs. Common conditions treated include asthma, allergies, and arthritis. It is also used for other conditions, such as blood disorders and diseases of the adrenal glands. This medicine may be used for other purposes; ask your health care provider or pharmacist if you have questions. COMMON BRAND NAME(S): Millipred, Millipred DP, Millipred DP 12-Day, Millipred DP 6 Day, Prednoral What should I tell my health care provider before I take this medicine? They need to know if you have any of these conditions:  Cushing's syndrome  diabetes  glaucoma  heart problems or disease  high blood pressure  infection such as herpes, measles, tuberculosis, or chickenpox  kidney disease  liver disease  mental problems  myasthenia gravis  osteoporosis  seizures  stomach ulcer or intestine disease including colitis and diverticulitis  thyroid problem  an unusual or allergic reaction to lactose, prednisolone, other medicines, foods, dyes, or preservatives  pregnant or trying to get pregnant  breast-feeding How should I use this medicine? Take this medicine by mouth with a glass of water. Follow the directions on the prescription label. Take it with food or milk to avoid stomach upset. If you are taking this medicine once a day, take it in the morning. Do not take more medicine than you are told to take. Do not suddenly stop taking your medicine because you may develop a severe reaction. Your doctor will tell you how much medicine to take. If your doctor wants you to stop the medicine, the dose may be slowly lowered over time to avoid any side effects. Talk to your pediatrician regarding the use of this medicine in children. Special care may be needed. Overdosage: If you think you have taken too much of this medicine  contact a poison control center or emergency room at once. NOTE: This medicine is only for you. Do not share this medicine with others. What if I miss a dose? If you miss a dose, take it as soon as you can. If it is almost time for your next dose, take only that dose. Do not take double or extra doses. What may interact with this medicine? Do not take this medicine with any of the following medications:  metyrapone  mifepristone This medicine may also interact with the following medications:  aminoglutethimide  amphotericin B  aspirin and aspirin-like medicines  barbiturates  certain medicines for diabetes, like glipizide or glyburide  cholestyramine  cholinesterase inhibitors  cyclosporine  digoxin  diuretics  ephedrine  female hormones, like estrogens and birth control pills  isoniazid  ketoconazole  NSAIDS, medicines for pain and inflammation, like ibuprofen or naproxen  phenytoin  rifampin  toxoids  vaccines  warfarin This list may not describe all possible interactions. Give your health care provider a list of all the medicines, herbs, non-prescription drugs, or dietary supplements you use. Also tell them if you smoke, drink alcohol, or use illegal drugs. Some items may interact with your medicine. What should I watch for while using this medicine? Visit your doctor or health care professional for regular checks on your progress. If you are taking this medicine over a prolonged period, carry an identification card with your name and address, the type and dose of your medicine, and your doctor's name and address. This medicine may increase your risk of getting an infection. Tell your doctor or health care professional  if you are around anyone with measles or chickenpox, or if you develop sores or blisters that do not heal properly. If you are going to have surgery, tell your doctor or health care professional that you have taken this medicine within the last  twelve months. Ask your doctor or health care professional about your diet. You may need to lower the amount of salt you eat. This medicine may increase blood sugar. Ask your healthcare provider if changes in diet or medicines are needed if you have diabetes. What side effects may I notice from receiving this medicine? Side effects that you should report to your doctor or health care professional as soon as possible:  allergic reactions like skin rash, itching or hives, swelling of the face, lips, or tongue  changes in emotions or moods  eye pain   signs and symptoms of high blood sugar such as being more thirsty or hungry or having to urinate more than normal. You may also feel very tired or have blurry vision.  signs and symptoms of infection like fever or chills; cough; sore throat; pain or trouble passing urine  slow growth in children (if used for longer periods of time)  swelling of ankles, feet  trouble sleeping  weak bones (if used for longer periods of time) Side effects that usually do not require medical attention (report to your doctor or health care professional if they continue or are bothersome):  nausea  skin problems, acne, thin and shiny skin  upset stomach  weight gain This list may not describe all possible side effects. Call your doctor for medical advice about side effects. You may report side effects to FDA at 1-800-FDA-1088. Where should I keep my medicine? Keep out of the reach of children. Store at room temperature between 15 and 30 degrees C (59 and 86 degrees F). Keep container tightly closed. Throw away any unused medicine after the expiration date. NOTE: This sheet is a summary. It may not cover all possible information. If you have questions about this medicine, talk to your doctor, pharmacist, or health care provider.  2021 Elsevier/Gold Standard (2018-08-10 10:30:56) Amoxicillin; Clavulanic Acid Tablets What is this medicine? AMOXICILLIN;  CLAVULANIC ACID (a mox i SIL in; KLAV yoo lan ic AS id) is a penicillin antibiotic. It treats some infections caused by bacteria. It will not work for colds, the flu, or other viruses. This medicine may be used for other purposes; ask your health care provider or pharmacist if you have questions. COMMON BRAND NAME(S): Augmentin What should I tell my health care provider before I take this medicine? They need to know if you have any of these conditions:  kidney disease  liver disease  mononucleosis  stomach or intestine problems such as colitis  an unusual or allergic reaction to amoxicillin, other penicillin or cephalosporin antibiotics, clavulanic acid, other medicines, foods, dyes, or preservatives  pregnant or trying to get pregnant  breast-feeding How should I use this medicine? Take this drug by mouth. Take it as directed on the prescription label at the same time every day. Take it with food at the start of a meal or snack. Take all of this drug unless your health care provider tells you to stop it early. Keep taking it even if you think you are better. Talk to your health care provider about the use of this drug in children. While it may be prescribed for selected conditions, precautions do apply. Overdosage: If you think you have taken too much  of this medicine contact a poison control center or emergency room at once. NOTE: This medicine is only for you. Do not share this medicine with others. What if I miss a dose? If you miss a dose, take it as soon as you can. If it is almost time for your next dose, take only that dose. Do not take double or extra doses. What may interact with this medicine?  allopurinol  anticoagulants  birth control pills  methotrexate  probenecid This list may not describe all possible interactions. Give your health care provider a list of all the medicines, herbs, non-prescription drugs, or dietary supplements you use. Also tell them if you  smoke, drink alcohol, or use illegal drugs. Some items may interact with your medicine. What should I watch for while using this medicine? Tell your health care provider if your symptoms do not start to get better or if they get worse. This medicine may cause serious skin reactions. They can happen weeks to months after starting the medicine. Contact your health care provider right away if you notice fevers or flu-like symptoms with a rash. The rash may be red or purple and then turn into blisters or peeling of the skin. Or, you might notice a red rash with swelling of the face, lips or lymph nodes in your neck or under your arms. Do not treat diarrhea with over the counter products. Contact your health care provider if you have diarrhea that lasts more than 2 days or if it is severe and watery. If you have diabetes, you may get a false-positive result for sugar in your urine. Check with your health care provider. Birth control may not work properly while you are taking this medicine. Talk to your health care provider about using an extra method of birth control. What side effects may I notice from receiving this medicine? Side effects that you should report to your doctor or health care provider as soon as possible:  allergic reactions (skin rash, itching or hives; swelling of the face, lips, or tongue)  bloody or watery diarrhea  dark urine  infection (fever, chills, cough, sore throat, or pain)  kidney injury (trouble passing urine or change in the amount of urine)  redness, blistering, peeling, or loosening of the skin, including inside the mouth  seizures  thrush (white patches in the mouth or mouth sores)  trouble breathing  unusual bruising or bleeding  unusually weak or tired Side effects that usually do not require medical attention (report to your doctor or health care provider if they continue or are bothersome):  diarrhea  dizziness  headache  nausea,  vomiting  unusual vaginal discharge, itching, or odor  upset stomach This list may not describe all possible side effects. Call your doctor for medical advice about side effects. You may report side effects to FDA at 1-800-FDA-1088. Where should I keep my medicine? Keep out of the reach of children and pets. Store at room temperature between 20 and 25 degrees C (68 and 77 degrees F). Throw away any unused drug after the expiration date. NOTE: This sheet is a summary. It may not cover all possible information. If you have questions about this medicine, talk to your doctor, pharmacist, or health care provider.  2021 Elsevier/Gold Standard (2020-10-01 09:45:03) Shingles  Shingles is an infection. It gives you a painful skin rash and blisters that have fluid in them. Shingles is caused by the same germ (virus) that causes chickenpox. Shingles only happens in people  who:  Have had chickenpox.  Have been given a shot of medicine (vaccine) to protect against chickenpox. Shingles is rare in this group. The first symptoms of shingles may be itching, tingling, or pain in an area on your skin. A rash will show on your skin a few days or weeks later. The rash is likely to be on one side of your body. The rash usually has a shape like a belt or a band. Over time, the rash turns into fluid-filled blisters. The blisters will break open, change into scabs, and dry up. Medicines may:  Help with pain and itching.  Help you get better sooner.  Help to prevent long-term problems. Follow these instructions at home: Medicines  Take over-the-counter and prescription medicines only as told by your doctor.  Put on an anti-itch cream or numbing cream where you have a rash, blisters, or scabs. Do this as told by your doctor. Helping with itching and discomfort  Put cold, wet cloths (cold compresses) on the area of the rash or blisters as told by your doctor.  Cool baths can help you feel better. Try adding  baking soda or dry oatmeal to the water to lessen itching. Do not bathe in hot water.   Blister and rash care  Keep your rash covered with a loose bandage (dressing).  Wear loose clothing that does not rub on your rash.  Keep your rash and blisters clean. To do this, wash the area with mild soap and cool water as told by your doctor.  Check your rash every day for signs of infection. Check for: ? More redness, swelling, or pain. ? Fluid or blood. ? Warmth. ? Pus or a bad smell.  Do not scratch your rash. Do not pick at your blisters. To help you to not scratch: ? Keep your fingernails clean and cut short. ? Wear gloves or mittens when you sleep, if scratching is a problem. General instructions  Rest as told by your doctor.  Keep all follow-up visits as told by your doctor. This is important.  Wash your hands often with soap and water. If soap and water are not available, use hand sanitizer. Doing this lowers your chance of getting a skin infection caused by germs (bacteria).  Your infection can cause chickenpox in people who have never had chickenpox or never got a shot of chickenpox vaccine. If you have blisters that did not change into scabs yet, try not to touch other people or be around other people, especially: ? Babies. ? Pregnant women. ? Children who have areas of red, itchy, or rough skin (eczema). ? Very old people who have transplants. ? People who have a long-term (chronic) sickness, like cancer or AIDS. Contact a doctor if:  Your pain does not get better with medicine.  Your pain does not get better after the rash heals.  You have any signs of infection in the rash area. These signs include: ? More redness, swelling, or pain around the rash. ? Fluid or blood coming from the rash. ? The rash area feeling warm to the touch. ? Pus or a bad smell coming from the rash. Get help right away if:  The rash is on your face or nose.  You have pain in your face or pain  by your eye.  You lose feeling on one side of your face.  You have trouble seeing.  You have ear pain, or you have ringing in your ear.  You have a loss  of taste.  Your condition gets worse. Summary  Shingles gives you a painful skin rash and blisters that have fluid in them.  Shingles is an infection. It is caused by the same germ (virus) that causes chickenpox.  Keep your rash covered with a loose bandage (dressing). Wear loose clothing that does not rub on your rash.  If you have blisters that did not change into scabs yet, try not to touch other people or be around people. This information is not intended to replace advice given to you by your health care provider. Make sure you discuss any questions you have with your health care provider. Document Revised: 03/02/2019 Document Reviewed: 07/13/2017 Elsevier Patient Education  2021 [employer]. Famciclovir tablets What is this medicine? FAMCICLOVIR (fam SYE kloe veer) is an antiviral medicine. It is used to treat or prevent infections caused by certain kinds of viruses. Examples of these infections are herpes and shingles. This medicine will not cure herpes. This medicine may be used for other purposes; ask your health care provider or pharmacist if you have questions. COMMON BRAND NAME(S): Famvir What should I tell my health care provider before I take this medicine? They need to know if you have any of these conditions:  kidney disease  an unusual or allergic reaction to famciclovir, penciclovir, other medicines, food, dyes, or preservatives  pregnant or trying to get pregnant  breast-feeding How should I use this medicine? Take this medicine by mouth with a glass of water. Follow the directions on the prescription label. You can take it with or without food. Take your medicine at regular intervals. Do not take your medicine more often than directed. Take all of your medicine as directed even if you think you are better.  Do not skip doses or stop your medicine early. Talk to your pediatrician regarding the use of this medicine in children. Special care may be needed. Overdosage: If you think you have taken too much of this medicine contact a poison control center or emergency room at once. NOTE: This medicine is only for you. Do not share this medicine with others. What if I miss a dose? If you miss a dose, take it as soon as you can. If it is almost time for your next dose, take only that dose. Do not take double or extra doses. What may interact with this medicine?  probenecid This list may not describe all possible interactions. Give your health care provider a list of all the medicines, herbs, non-prescription drugs, or dietary supplements you use. Also tell them if you smoke, drink alcohol, or use illegal drugs. Some items may interact with your medicine. What should I watch for while using this medicine? Tell your doctor or health care professional if your symptoms do not improve. This medicine works best when started very early in the course of an infection. Begin treatment at the first signs of infection. Drink 6 to 8 glasses of water or fluids every day while you are taking this medicine. This will help prevent side effects. You can still pass the shingles or herpes to another person even while you are taking this medicine. Avoid contact with others as directed. Genital herpes is a sexually transmitted disease. Talk to your doctor about how to stop the spread of infection. What side effects may I notice from receiving this medicine? Side effects that you should report to your doctor or health care professional as soon as possible:  allergic reactions like skin rash, itching or  hives, swelling of the face, lips, or tongue  confusion, hallucinations Side effects that usually do not require medical attention (report to your doctor or health care professional if they continue or are  bothersome):  diarrhea  headache  nausea, vomiting  stomach upset  unusually weak or tired This list may not describe all possible side effects. Call your doctor for medical advice about side effects. You may report side effects to FDA at 1-800-FDA-1088. Where should I keep my medicine? Keep out of the reach of children. Store at room temperature between 15 and 30 degrees C (59 and 86 degrees F). Throw away any unused medicine after the expiration date. NOTE: This sheet is a summary. It may not cover all possible information. If you have questions about this medicine, talk to your doctor, pharmacist, or health care provider.  2021 Elsevier/Gold Standard (2008-01-24 13:52:39) Sinusitis, Adult Sinusitis is soreness and swelling (inflammation) of your sinuses. Sinuses are hollow spaces in the bones around your face. They are located:  Around your eyes.  In the middle of your forehead.  Behind your nose.  In your cheekbones. Your sinuses and nasal passages are lined with a fluid called mucus. Mucus drains out of your sinuses. Swelling can trap mucus in your sinuses. This lets germs (bacteria, virus, or fungus) grow, which leads to infection. Most of the time, this condition is caused by a virus. What are the causes? This condition is caused by:  Allergies.  Asthma.  Germs.  Things that block your nose or sinuses.  Growths in the nose (nasal polyps).  Chemicals or irritants in the air.  Fungus (rare). What increases the risk? You are more likely to develop this condition if:  You have a weak body defense system (immune system).  You do a lot of swimming or diving.  You use nasal sprays too much.  You smoke. What are the signs or symptoms? The main symptoms of this condition are pain and a feeling of pressure around the sinuses. Other symptoms include:  Stuffy nose (congestion).  Runny nose (drainage).  Swelling and warmth in the  sinuses.  Headache.  Toothache.  A cough that may get worse at night.  Mucus that collects in the throat or the back of the nose (postnasal drip).  Being unable to smell and taste.  Being very tired (fatigue).  A fever.  Sore throat.  Bad breath. How is this diagnosed? This condition is diagnosed based on:  Your symptoms.  Your medical history.  A physical exam.  Tests to find out if your condition is short-term (acute) or long-term (chronic). Your doctor may: ? Check your nose for growths (polyps). ? Check your sinuses using a tool that has a light (endoscope). ? Check for allergies or germs. ? Do imaging tests, such as an MRI or CT scan. How is this treated? Treatment for this condition depends on the cause and whether it is short-term or long-term.  If caused by a virus, your symptoms should go away on their own within 10 days. You may be given medicines to relieve symptoms. They include: ? Medicines that shrink swollen tissue in the nose. ? Medicines that treat allergies (antihistamines). ? A spray that treats swelling of the nostrils. ? Rinses that help get rid of thick mucus in your nose (nasal saline washes).  If caused by bacteria, your doctor may wait to see if you will get better without treatment. You may be given antibiotic medicine if you have: ? A  very bad infection. ? A weak body defense system.  If caused by growths in the nose, you may need to have surgery. Follow these instructions at home: Medicines  Take, use, or apply over-the-counter and prescription medicines only as told by your doctor. These may include nasal sprays.  If you were prescribed an antibiotic medicine, take it as told by your doctor. Do not stop taking the antibiotic even if you start to feel better. Hydrate and humidify  Drink enough water to keep your pee (urine) pale yellow.  Use a cool mist humidifier to keep the humidity level in your home above 50%.  Breathe in  steam for 10-15 minutes, 3-4 times a day, or as told by your doctor. You can do this in the bathroom while a hot shower is running.  Try not to spend time in cool or dry air.   Rest  Rest as much as you can.  Sleep with your head raised (elevated).  Make sure you get enough sleep each night. General instructions  Put a warm, moist washcloth on your face 3-4 times a day, or as often as told by your doctor. This will help with discomfort.  Wash your hands often with soap and water. If there is no soap and water, use hand sanitizer.  Do not smoke. Avoid being around people who are smoking (secondhand smoke).  Keep all follow-up visits as told by your doctor. This is important.   Contact a doctor if:  You have a fever.  Your symptoms get worse.  Your symptoms do not get better within 10 days. Get help right away if:  You have a very bad headache.  You cannot stop throwing up (vomiting).  You have very bad pain or swelling around your face or eyes.  You have trouble seeing.  You feel confused.  Your neck is stiff.  You have trouble breathing. Summary  Sinusitis is swelling of your sinuses. Sinuses are hollow spaces in the bones around your face.  This condition is caused by tissues in your nose that become inflamed or swollen. This traps germs. These can lead to infection.  If you were prescribed an antibiotic medicine, take it as told by your doctor. Do not stop taking it even if you start to feel better.  Keep all follow-up visits as told by your doctor. This is important. This information is not intended to replace advice given to you by your health care provider. Make sure you discuss any questions you have with your health care provider. Document Revised: 04/10/2018 Document Reviewed: 04/10/2018 Elsevier Patient Education  2021 [employer].

## 2020-12-25 ENCOUNTER — Encounter: Payer: Self-pay | Admitting: Obstetrics and Gynecology

## 2020-12-31 MED ORDER — NORETHINDRONE ACETATE 5 MG PO TABS: 5.0000 mg | ORAL_TABLET | Freq: Every day | ORAL | 0 refills | Status: DC

## 2021-01-01 ENCOUNTER — Other Ambulatory Visit: Payer: Self-pay | Admitting: Obstetrics and Gynecology

## 2021-01-01 MED ORDER — NORETHINDRONE ACETATE 5 MG PO TABS: 5.0000 mg | ORAL_TABLET | Freq: Every day | ORAL | 0 refills | Status: DC

## 2021-04-21 ENCOUNTER — Ambulatory Visit: Payer: Self-pay | Admitting: *Deleted

## 2021-04-21 NOTE — Telephone Encounter (Signed)
Per initial encounter, "Right side discomfort for 3 days. No other symptoms, patient is not feeling nausea";  the pain started on 04/17/21 around 1100; it is sore to the touch and with movement; she has 3-7 out of 10; her pain is worse with movement; the pt has taken 400 mg ibuprofen x 3 doses which reduced her pain to 2 out of 10; the affected area is above the top of her right hip is affected the most; the pt says she can not tell if it is muscular pain; her pain is also worse when she is sitting and leaning forward;  recommendations made per nurse triage protocol; the pt asked if this could be an ovarian cyst; she denies history of this; pt also asked to consider GYN evaluation if this is her concern; she prefers to be seen by her PCP first; decision tree completed; the pt sees Vernie Murders at [facility] but he has no availability within the guidelines per protocol; per Jiles Garter pt scheduled to see this provider at [place] on 04/23/21; will route encounter to office  Reason for Disposition . [1] MODERATE pain (e.g., interferes with normal activities) AND [2] present > 3 days  Answer Assessment - Initial Assessment Questions 1. ONSET: "When did the muscle aches or body pains start?"      04/17/21 at 1100 2. LOCATION: "What part of your body is hurting?" (e.g., entire body, arms, legs)      Right side 3. SEVERITY: "How bad is the pain?" (Scale 1-10; or mild, moderate, severe)   - MILD (1-3): doesn't interfere with normal activities    - MODERATE (4-7): interferes with normal activities or awakens from sleep    - SEVERE (8-10):  excruciating pain, unable to do any normal activities   3 out of 10; intermittent; 7 out of 10 x 1 episode lasting seconds on 04/19/21 4. CAUSE: "What do you think is causing the pains?"     Not sure 5. FEVER: "Have you been having fever?"    no 6. OTHER SYMPTOMS: "Do you have any other symptoms?" (e.g., chest pain, weakness, rash, cold or flu symptoms, weight  loss)    Worse when sitting and leaning forward 7. PREGNANCY: "Is there any chance you are pregnant?" "When was your last menstrual period?"   No LMP 04/05/21 8. TRAVEL: "Have you traveled out of the country in the last month?" (e.g., travel history, exposures)     no  Protocols used: MUSCLE ACHES AND BODY PAIN-A-AH

## 2021-04-23 ENCOUNTER — Encounter: Payer: Self-pay | Admitting: Family Medicine

## 2021-04-23 ENCOUNTER — Other Ambulatory Visit: Payer: Self-pay | Admitting: Family Medicine

## 2021-04-23 ENCOUNTER — Other Ambulatory Visit: Payer: Self-pay

## 2021-04-23 ENCOUNTER — Ambulatory Visit: Admission: RE | Admit: 2021-04-23 | Payer: BC Managed Care – PPO | Source: Ambulatory Visit

## 2021-04-23 ENCOUNTER — Telehealth: Payer: Self-pay

## 2021-04-23 ENCOUNTER — Ambulatory Visit: Payer: BC Managed Care – PPO | Admitting: Family Medicine

## 2021-04-23 VITALS — BP 112/74 | HR 79 | Wt 192.0 lb

## 2021-04-23 DIAGNOSIS — R1031 Right lower quadrant pain: Secondary | ICD-10-CM

## 2021-04-23 DIAGNOSIS — N926 Irregular menstruation, unspecified: Secondary | ICD-10-CM

## 2021-04-23 LAB — POCT URINALYSIS DIPSTICK
Bilirubin, UA: NEGATIVE
Blood, UA: NEGATIVE
Glucose, UA: NEGATIVE
Ketones, UA: NEGATIVE
Leukocytes, UA: NEGATIVE
Nitrite, UA: NEGATIVE
Protein, UA: NEGATIVE
Spec Grav, UA: >=1.03 — AB (ref 1.010–1.025)
Urobilinogen, UA: 0.2 E.U./dL
pH, UA: 5 (ref 5.0–8.0)

## 2021-04-23 NOTE — Telephone Encounter (Signed)
Copied from [place] [id number]. Topic: Appointment Scheduling - Scheduling Inquiry for Clinic >> Apr 23, 2021 12:15 PM Valere Dross wrote: Reason for CRM: Kim from  [employer] at [facility] called in and stated that the pt did not show up to her appt. Please advise

## 2021-04-23 NOTE — Progress Notes (Signed)
Established patient visit   Patient: Amanda Coffey   DOB: Mar 26, 1976   45 y.o. Female  MRN: [id number] Visit Date: 04/23/2021  Today's healthcare provider: Vernie Murders, PA-C   Chief Complaint  Patient presents with  . Abdominal Pain    Right lower    Subjective    Abdominal Pain This is a new problem. The current episode started in the past 7 days. The problem has been unchanged. The pain is located in the RUQ. The pain is at a severity of 2/10. The quality of the pain is aching. The abdominal pain does not radiate. Pertinent negatives include no constipation, diarrhea, nausea or vomiting. Exacerbated by: Position changes.  Treatments tried: Ibuprofen. The treatment provided moderate relief.   HPI    Abdominal Pain     Additional comments: Right lower        Last edited by Kizzie Furnish, CMA on 04/23/2021  8:17 AM. (History)        Patient Active Problem List   Diagnosis Date Noted  . Acute non-recurrent pansinusitis 12/17/2020  . History of shingles 12/17/2020  . Skin sensitivity 12/17/2020  . History of COVID-19 12/17/2020  . Heat intolerance 11/03/2020  . Increased risk of breast cancer   . 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 Family history of breast cancer 01/12/2018  . Abnormal blood chemistry 04/20/2016  . Abnormal liver enzymes 04/20/2016  . Interstitial cystitis 04/20/2016  . Ganglion cyst of wrist 04/20/2016  . 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 Herpes zoster without complication 68/09/5725   Past Medical History:  Diagnosis Date  . Abnormal Pap smear of cervix 1999   LGSIL  . BRCA negative 2013; 2/19   BRCA neg; MyRisk neg  . Family history of breast cancer    has tested negative for HBOC in 2013/ negative Myrisk test 2019/ lifetime risk 24.6%  . GERD (gastroesophageal reflux disease)   . Goiter   . History of chicken pox   . Increased risk of breast cancer    IBIS=25%, riskscore=24.6%  . Interstitial cystitis   . Screening for colon  cancer 10/2020   Cologuard NEG; repeat after 3 yrs  . Shingles    Social History   Tobacco Use  . Smoking status: Never Smoker  . Smokeless tobacco: Never Used  Vaping Use  . Vaping Use: Never used  Substance Use Topics  . Alcohol use: No  . Drug use: No   No Known Allergies   Medications: Outpatient Medications Prior to Visit  Medication Sig  . Cetirizine HCl 10 MG CAPS Take 1 tablet by mouth daily.  . Levonorgestrel-Ethinyl Estradiol (AMETHIA) 0.15-0.03 &0.01 MG tablet Take 1 tablet by mouth daily.  . Multiple Vitamins-Minerals (MULTIVITAMIN WITH MINERALS) tablet Take 1 tablet by mouth daily.  Marland Kitchen amoxicillin-clavulanate (AUGMENTIN) 875-125 MG tablet Take 1 tablet by mouth 2 (two) times daily.  . famciclovir (FAMVIR) 500 MG tablet Take 1 tablet (500 mg total) by mouth 3 (three) times daily.  . norethindrone (AYGESTIN) 5 MG tablet Take 1 tablet (5 mg total) by mouth daily for 10 days.  . predniSONE (STERAPRED UNI-PAK 21 TAB) 10 MG (21) TBPK tablet PO: Take 6 tablets on day 1:Take 5 tablets day 2:Take 4 tablets day 3: Take 3 tablets day 4:Take 2 tablets day five: 5 Take 1 tablet day 6   No facility-administered medications prior to visit.    Review of Systems  Constitutional: Negative.   Respiratory: Negative.   Cardiovascular: Negative.  Gastrointestinal: Positive for abdominal pain. Negative for abdominal distention, anal bleeding, blood in stool, constipation, diarrhea, nausea, rectal pain and vomiting.  Genitourinary: Negative.        Objective    BP 112/74 (BP Location: Right Arm, Patient Position: Sitting, Cuff Size: Large)   Pulse 79   Wt 192 lb (87.1 kg)   SpO2 98%   BMI 30.07 kg/m     Physical Exam    No results found for any visits on 04/23/21.  Assessment & Plan    1. RLQ abdominal pain Onset 3-4 days ago without known injury. No dysuria and normal urinalysis. Irregular menses over the past 3 months. LMP 2-3 weeks ago. No GI upset. Will get pelvic  ultrasound to rule out ovarian cyst. Been on BCP since December 2021 and husband has had a vasectomy. May need referral to GYN pending reports. - POCT urinalysis dipstick - US Transvaginal Non-OB; Future - CBC with Differential/Platelet - Comprehensive metabolic panel   No follow-ups on file.      I, Neeko Pharo, PA-C, have reviewed all documentation for this visit. The documentation on 04/23/21 for the exam, diagnosis, procedures, and orders are all accurate and complete.    Vernie Murders, PA-C  [employer] [phone] (phone) [phone] (fax)  [place]

## 2021-04-24 ENCOUNTER — Ambulatory Visit
Admission: RE | Admit: 2021-04-24 | Discharge: 2021-04-24 | Disposition: A | Discharge status: Home / Self Care | Payer: BC Managed Care – PPO | Source: Ambulatory Visit | Attending: Family Medicine | Admitting: Family Medicine

## 2021-04-24 DIAGNOSIS — N926 Irregular menstruation, unspecified: Secondary | ICD-10-CM

## 2021-04-24 DIAGNOSIS — R1031 Right lower quadrant pain: Secondary | ICD-10-CM | POA: Diagnosis present

## 2021-04-24 LAB — COMPREHENSIVE METABOLIC PANEL
ALT: 82 IU/L — ABNORMAL HIGH (ref 0–32)
AST: 35 IU/L (ref 0–40)
Albumin/Globulin Ratio: 2 (ref 1.2–2.2)
Albumin: 4.7 g/dL (ref 3.8–4.8)
Alkaline Phosphatase: 66 IU/L (ref 44–121)
BUN/Creatinine Ratio: 15 (ref 9–23)
BUN: 11 mg/dL (ref 6–24)
Bilirubin Total: 0.3 mg/dL (ref 0.0–1.2)
CO2: 24 mmol/L (ref 20–29)
Calcium: 9.8 mg/dL (ref 8.7–10.2)
Chloride: 103 mmol/L (ref 96–106)
Creatinine, Ser: 0.75 mg/dL (ref 0.57–1.00)
Globulin, Total: 2.4 g/dL (ref 1.5–4.5)
Glucose: 91 mg/dL (ref 65–99)
Potassium: 4.5 mmol/L (ref 3.5–5.2)
Sodium: 140 mmol/L (ref 134–144)
Total Protein: 7.1 g/dL (ref 6.0–8.5)
eGFR: 99 mL/min/{1.73_m2} (ref >59)

## 2021-04-24 LAB — CBC WITH DIFFERENTIAL/PLATELET
Basophils Absolute: 0 10*3/uL (ref 0.0–0.2)
Basos: 1 %
EOS (ABSOLUTE): 0.2 10*3/uL (ref 0.0–0.4)
Eos: 3 %
Hematocrit: 43 % (ref 34.0–46.6)
Hemoglobin: 14.4 g/dL (ref 11.1–15.9)
Immature Grans (Abs): 0 10*3/uL (ref 0.0–0.1)
Immature Granulocytes: 0 %
Lymphocytes Absolute: 1.6 10*3/uL (ref 0.7–3.1)
Lymphs: 26 %
MCH: 30.8 pg (ref 26.6–33.0)
MCHC: 33.5 g/dL (ref 31.5–35.7)
MCV: 92 fL (ref 79–97)
Monocytes Absolute: 0.4 10*3/uL (ref 0.1–0.9)
Monocytes: 6 %
Neutrophils Absolute: 3.8 10*3/uL (ref 1.4–7.0)
Neutrophils: 64 %
Platelets: 358 10*3/uL (ref 150–450)
RBC: 4.67 x10E6/uL (ref 3.77–5.28)
RDW: 11.2 % — ABNORMAL LOW (ref 11.7–15.4)
WBC: 6 10*3/uL (ref 3.4–10.8)

## 2021-04-24 NOTE — Telephone Encounter (Signed)
Amanda Coffey

## 2021-04-27 ENCOUNTER — Encounter: Payer: Self-pay | Admitting: Obstetrics and Gynecology

## 2021-04-28 ENCOUNTER — Other Ambulatory Visit: Payer: Self-pay | Admitting: Obstetrics and Gynecology

## 2021-04-28 MED ORDER — LEVONORGESTREL-ETHINYL ESTRAD 0.1-20 MG-MCG PO TABS: 1.0000 | ORAL_TABLET | Freq: Every day | ORAL | 1 refills | Status: DC

## 2021-04-28 NOTE — Progress Notes (Signed)
Rx OCP change to aviane from [facility] for monthly period. Has BTB with extended dosing

## 2021-09-11 ENCOUNTER — Encounter: Payer: Self-pay | Admitting: Obstetrics and Gynecology

## 2021-09-11 ENCOUNTER — Other Ambulatory Visit: Payer: Self-pay | Admitting: Obstetrics and Gynecology

## 2021-09-11 MED ORDER — NORETHINDRONE 0.35 MG PO TABS: 1.0000 | ORAL_TABLET | Freq: Every day | ORAL | 0 refills | Status: DC

## 2021-09-11 NOTE — Progress Notes (Signed)
BC change to POP per pt pref. Wants to decrease estrogen exposure

## 2021-10-17 ENCOUNTER — Other Ambulatory Visit: Payer: Self-pay | Admitting: Obstetrics and Gynecology

## 2021-11-19 ENCOUNTER — Other Ambulatory Visit: Payer: Self-pay

## 2021-11-19 ENCOUNTER — Other Ambulatory Visit (HOSPITAL_COMMUNITY)
Admission: RE | Admit: 2021-11-19 | Discharge: 2021-11-19 | Disposition: A | Discharge status: Home / Self Care | Payer: BC Managed Care – PPO | Source: Ambulatory Visit | Attending: Obstetrics and Gynecology | Admitting: Obstetrics and Gynecology

## 2021-11-19 ENCOUNTER — Encounter: Payer: Self-pay | Admitting: Obstetrics and Gynecology

## 2021-11-19 ENCOUNTER — Ambulatory Visit (INDEPENDENT_AMBULATORY_CARE_PROVIDER_SITE_OTHER): Payer: BC Managed Care – PPO | Admitting: Obstetrics and Gynecology

## 2021-11-19 VITALS — BP 128/72 | Ht 67.0 in | Wt 188.0 lb

## 2021-11-19 DIAGNOSIS — R87612 Low grade squamous intraepithelial lesion on cytologic smear of cervix (LGSIL): Secondary | ICD-10-CM | POA: Insufficient documentation

## 2021-11-19 DIAGNOSIS — Z1151 Encounter for screening for human papillomavirus (HPV): Secondary | ICD-10-CM

## 2021-11-19 DIAGNOSIS — Z803 Family history of malignant neoplasm of breast: Secondary | ICD-10-CM

## 2021-11-19 DIAGNOSIS — Z124 Encounter for screening for malignant neoplasm of cervix: Secondary | ICD-10-CM | POA: Insufficient documentation

## 2021-11-19 DIAGNOSIS — Z1231 Encounter for screening mammogram for malignant neoplasm of breast: Secondary | ICD-10-CM

## 2021-11-19 DIAGNOSIS — Z3041 Encounter for surveillance of contraceptive pills: Secondary | ICD-10-CM

## 2021-11-19 DIAGNOSIS — Z9189 Other specified personal risk factors, not elsewhere classified: Secondary | ICD-10-CM

## 2021-11-19 DIAGNOSIS — Z01419 Encounter for gynecological examination (general) (routine) without abnormal findings: Secondary | ICD-10-CM

## 2021-11-19 MED ORDER — NORETHINDRONE 0.35 MG PO TABS: 1.0000 | ORAL_TABLET | Freq: Every day | ORAL | 3 refills | Status: DC

## 2021-11-19 NOTE — Progress Notes (Signed)
PCP: Chrismon, Vickki Muff, PA-C (Inactive)   Chief Complaint  Patient presents with   Gynecologic Exam    Annual Exam    HPI:      [name] is a 45 y.o. [id number] whose LMP was Patient's last menstrual period was 11/19/2021., presents today for her annual examination.  Her menses are regular every 4 wks on POPs (were Q3 wks last yr off BC), lasting 4-5 days, mod flow.  Dysmenorrhea none. She does not have intermenstrual bleeding.   Hx of feeling hot all the time, particularly in neck/head. Sometimes with night sweats. Sx for several yrs. Sees endocrine for thyroid nodules with normal labs. No thyroid tx needed. Tried OCPs last yr for period sx and feeling hot with sx relief. Due to strong FH breast cancer, decided to change to POPs. Still having relief of feeling hot. Doing well so far. No hx of HTN, DVTs, migraines with aura.   Sex activity: single partner, contraception - vasectomy. She does not have vaginal dryness.  Last Pap: 01/06/18  Results were: no abnormalities /neg HPV DNA. Due Q3 yrs.  Hx of STDs: HPV--VAIN 1 and LGSIL  Last mammogram: 12/05/20  Results were: normal--routine follow-up in 12 months There is FH of breast cancer in her mother, paternal second cousins x 2, and paternal grandmother. There is no FH of ovarian cancer. Pt is MyRisk neg Feb 06, 2018; IBIS=25%, riskscore=24.6%. The patient does not do self-breast exams. Has not had scr breast MRI.  Colonoscopy: never; neg Cologuard 1/22, repeat after 3 yrs  Tobacco use: The patient denies current or previous tobacco use. Alcohol use: none  No drug use Exercise: not active  She does not get adequate calcium and Vitamin D in her diet.  Labs with endocrine.   Past Medical History:  Diagnosis Date   Abnormal Pap smear of cervix 02-06-98   LGSIL   BRCA negative 07-Feb-2012; 2/19   BRCA neg; MyRisk neg   Family history of breast cancer    has tested negative for HBOC in 02/07/12 negative Myrisk test 02/06/2018 lifetime risk 24.6%    GERD (gastroesophageal reflux disease)    Goiter    History of chicken pox    Increased risk of breast cancer    IBIS=25%, riskscore=24.6%   Interstitial cystitis    Screening for colon cancer 10/2020   Cologuard NEG; repeat after 3 yrs   Shingles     Past Surgical History:  Procedure Laterality Date   COLPOSCOPY  06-Feb-1998   LGSIL Pap, mild dysplasia, VAIN 1   DILATION AND CURETTAGE OF UTERUS  02/07/2004   due to missed abortion   HYSTEROSCOPY WITH D & C N/A 06/27/2015   Procedure: DILATATION AND CURETTAGE /HYSTEROSCOPY;  Surgeon: Honor Loh Ward, MD;  Location: [facility] ORS;  Service: Gynecology;  Laterality: N/A;    Family History  Problem Relation Age of Onset   Hypertension Mother    Breast cancer Mother 8   Crohn's disease Father    Scoliosis Sister    Crohn's disease Paternal Grandmother    Breast cancer Paternal Grandmother 2   CAD Paternal Grandfather        stent   Breast cancer Cousin 15       2nd cousin paternal side. died in 02-06-2003   Colon cancer Paternal Uncle 73   Breast cancer Cousin 64       2nd cousin on paternal side   Hypertension Maternal Aunt    Multiple myeloma Maternal Aunt  Social History   Socioeconomic History   Marital status: Married    Spouse name: Johnathan   Number of children: 2   Years of education: Not on file   Highest education level: Not on file  Occupational History   Occupation: Designer, television/film set  Tobacco Use   Smoking status: Never   Smokeless tobacco: Never  Vaping Use   Vaping Use: Never used  Substance and Sexual Activity   Alcohol use: No   Drug use: No   Sexual activity: Yes    Partners: Male    Birth control/protection: Surgical    Comment: vasectomy  Other Topics Concern   Not on file  Social History Narrative   Not on file   Social Determinants of Health   Financial Resource Strain: Not on file  Food Insecurity: Not on file  Transportation Needs: Not on file  Physical Activity: Not on file  Stress: Not on  file  Social Connections: Not on file  Intimate Partner Violence: Not on file     Current Outpatient Medications:    Multiple Vitamins-Minerals (MULTIVITAMIN WITH MINERALS) tablet, Take 1 tablet by mouth daily., Disp: , Rfl:    amoxicillin-clavulanate (AUGMENTIN) 875-125 MG tablet, Take 1 tablet by mouth 2 (two) times daily., Disp: 20 tablet, Rfl: 0   Cetirizine HCl 10 MG CAPS, Take 1 tablet by mouth daily., Disp: , Rfl:    famciclovir (FAMVIR) 500 MG tablet, Take 1 tablet (500 mg total) by mouth 3 (three) times daily., Disp: 21 tablet, Rfl: 0   norethindrone (MICRONOR) 0.35 MG tablet, Take 1 tablet (0.35 mg total) by mouth daily., Disp: 84 tablet, Rfl: 3   predniSONE (STERAPRED UNI-PAK 21 TAB) 10 MG (21) TBPK tablet, PO: Take 6 tablets on day 1:Take 5 tablets day 2:Take 4 tablets day 3: Take 3 tablets day 4:Take 2 tablets day five: 5 Take 1 tablet day 6, Disp: 21 tablet, Rfl: 0     ROS:  Review of Systems  Constitutional:  Negative for fatigue, fever and unexpected weight change.  Respiratory:  Negative for cough, shortness of breath and wheezing.   Cardiovascular:  Negative for chest pain, palpitations and leg swelling.  Gastrointestinal:  Negative for blood in stool, constipation, diarrhea, nausea and vomiting.  Endocrine: Negative for cold intolerance, heat intolerance and polyuria.  Genitourinary:  Negative for dyspareunia, dysuria, flank pain, frequency, genital sores, hematuria, menstrual problem, pelvic pain, urgency, vaginal bleeding, vaginal discharge and vaginal pain.  Musculoskeletal:  Positive for arthralgias. Negative for back pain, joint swelling and myalgias.  Skin:  Negative for rash.  Neurological:  Positive for headaches. Negative for dizziness, syncope, light-headedness and numbness.  Hematological:  Negative for adenopathy.  Psychiatric/Behavioral:  Negative for agitation, confusion, sleep disturbance and suicidal ideas. The patient is not nervous/anxious.    BREAST: No symptoms    Objective: BP 128/72    Ht 5' 7"  (1.702 m)    Wt 188 lb (85.3 kg)    LMP 11/19/2021    BMI 29.44 kg/m    Physical Exam Constitutional:      Appearance: She is well-developed.  Genitourinary:     Vulva normal.     Right Labia: No rash, tenderness or lesions.    Left Labia: No tenderness, lesions or rash.    No vaginal discharge, erythema or tenderness.      Right Adnexa: not tender and no mass present.    Left Adnexa: not tender and no mass present.    No cervical friability or  polyp.     Uterus is not enlarged or tender.  Breasts:    Right: No mass, nipple discharge, skin change or tenderness.     Left: No mass, nipple discharge, skin change or tenderness.  Neck:     Thyroid: No thyromegaly.  Cardiovascular:     Rate and Rhythm: Normal rate and regular rhythm.     Heart sounds: Normal heart sounds. No murmur heard. Pulmonary:     Effort: Pulmonary effort is normal.     Breath sounds: Normal breath sounds.  Abdominal:     Palpations: Abdomen is soft.     Tenderness: There is no abdominal tenderness. There is no guarding or rebound.  Musculoskeletal:        General: Normal range of motion.     Cervical back: Normal range of motion.  Lymphadenopathy:     Cervical: No cervical adenopathy.  Neurological:     General: No focal deficit present.     Mental Status: She is alert and oriented to person, place, and time.     Cranial Nerves: No cranial nerve deficit.  Skin:    General: Skin is warm and dry.  Psychiatric:        Mood and Affect: Mood normal.        Behavior: Behavior normal.        Thought Content: Thought content normal.        Judgment: Judgment normal.  Vitals reviewed.    Assessment/Plan:  Encounter for annual routine gynecological examination  Cervical cancer screening - Plan: Cytology - PAP  Screening for HPV (human papillomavirus) - Plan: Cytology - PAP  LGSIL on Pap smear of cervix - Plan: Cytology - PAP; repeat papsQ3  yrs.   Encounter for surveillance of contraceptive pills - Plan: norethindrone (MICRONOR) 0.35 MG tablet; Rx RF. F/u prn.   Encounter for screening mammogram for malignant neoplasm of breast - Plan: MM 3D SCREEN BREAST BILATERAL; pt to [facility]  Family history of breast cancer - Plan: MM 3D SCREEN BREAST BILATERAL; Pt is MyRisk neg.  Increased risk of breast cancer - Plan: MM 3D SCREEN BREAST BILATERAL; recommended monthly SBE, yearly CBE and mammos; also scr breast MRI. Pt to call for ref prn. Add Vit D supp.   Meds ordered this encounter  Medications   norethindrone (MICRONOR) 0.35 MG tablet    Sig: Take 1 tablet (0.35 mg total) by mouth daily.    Dispense:  84 tablet    Refill:  3    Order Specific Question:   Supervising Provider    Answer:   Gae Dry [[id number]]            GYN counsel breast self exam, mammography screening, adequate intake of calcium and vitamin D, diet and exercise    F/U  Return in about 1 year (around 11/19/2022).  Peyson Delao B. Carlyn Lemke, PA-C 11/19/2021 4:29 PM

## 2021-11-25 LAB — CYTOLOGY - PAP
Comment: NEGATIVE
Diagnosis: NEGATIVE
High risk HPV: NEGATIVE

## 2022-01-04 ENCOUNTER — Other Ambulatory Visit: Payer: Self-pay

## 2022-01-04 ENCOUNTER — Ambulatory Visit
Admission: RE | Admit: 2022-01-04 | Discharge: 2022-01-04 | Disposition: A | Discharge status: Home / Self Care | Payer: BC Managed Care – PPO | Source: Ambulatory Visit | Attending: Obstetrics and Gynecology | Admitting: Obstetrics and Gynecology

## 2022-01-04 DIAGNOSIS — Z1231 Encounter for screening mammogram for malignant neoplasm of breast: Secondary | ICD-10-CM | POA: Insufficient documentation

## 2022-03-22 ENCOUNTER — Encounter: Payer: Self-pay | Admitting: Obstetrics and Gynecology

## 2022-03-23 MED ORDER — LORAZEPAM 0.5 MG PO TABS: 0.5000 mg | ORAL_TABLET | Freq: Three times a day (TID) | ORAL | 0 refills | Status: DC

## 2022-03-23 NOTE — Progress Notes (Signed)
Rx ativan for anxiety with long travel.  ?

## 2022-05-04 ENCOUNTER — Other Ambulatory Visit: Payer: Self-pay | Admitting: Obstetrics and Gynecology

## 2022-05-04 ENCOUNTER — Encounter: Payer: Self-pay | Admitting: Obstetrics and Gynecology

## 2022-05-04 MED ORDER — LEVONORGESTREL-ETHINYL ESTRAD 0.1-20 MG-MCG PO TABS: 1.0000 | ORAL_TABLET | Freq: Every day | ORAL | 1 refills | Status: DC

## 2022-05-04 NOTE — Progress Notes (Signed)
Rx RF vienva; pt prefers over POPs.

## 2022-10-21 ENCOUNTER — Other Ambulatory Visit: Payer: Self-pay | Admitting: Obstetrics and Gynecology

## 2022-10-25 ENCOUNTER — Other Ambulatory Visit: Payer: Self-pay

## 2022-10-25 DIAGNOSIS — Z3041 Encounter for surveillance of contraceptive pills: Secondary | ICD-10-CM

## 2022-10-25 MED ORDER — LEVONORGESTREL-ETHINYL ESTRAD 0.1-20 MG-MCG PO TABS: 1.0000 | ORAL_TABLET | Freq: Every day | ORAL | 0 refills | Status: DC

## 2022-10-25 NOTE — Telephone Encounter (Signed)
1 refill sent in until patient can be seen at January appt

## 2022-11-30 NOTE — Progress Notes (Unsigned)
PCP: Chrismon, Vickki Muff, PA-C (Inactive)   No chief complaint on file.   HPI:      [name] is a 47 y.o. [id number] whose LMP was No LMP recorded., presents today for her annual examination.  Her menses are regular every 4 wks on POPs (were Q3 wks last yr off BC), lasting 4-5 days, mod flow.  Dysmenorrhea none. She does not have intermenstrual bleeding.   Hx of feeling hot all the time, particularly in neck/head. Sometimes with night sweats. Sx for several yrs. Sees endocrine for thyroid nodules with normal labs. No thyroid tx needed. Tried OCPs last yr for period sx and feeling hot with sx relief. Due to strong FH breast cancer, decided to change to POPs. Still having relief of feeling hot. Doing well so far. No hx of HTN, DVTs, migraines with aura.   Sex activity: single partner, contraception - vasectomy. She does not have vaginal dryness.  Last Pap: 11/19/21  Results were: no abnormalities /neg HPV DNA. Due Q3 yrs.  Hx of STDs: HPV--VAIN 1 and LGSIL 1999  Last mammogram: 01/04/22  Results were: normal--routine follow-up in 12 months There is FH of breast cancer in her mother, paternal second cousins x 2, and paternal grandmother. There is no FH of ovarian cancer. Pt is MyRisk neg 03/08/18; IBIS=25%, riskscore=24.6%. The patient does not do self-breast exams. Has not had scr breast MRI.  Colonoscopy: never; neg Cologuard 1/22, repeat after 3 yrs  Tobacco use: The patient denies current or previous tobacco use. Alcohol use: none  No drug use Exercise: not active  She does not get adequate calcium and Vitamin D in her diet.  Labs with endocrine.   Past Medical History:  Diagnosis Date   Abnormal Pap smear of cervix 03-08-98   LGSIL   BRCA negative 03/08/2012; 2/19   BRCA neg; MyRisk neg   Family history of breast cancer    has tested negative for HBOC in March 08, 2012 negative Myrisk test 03/08/18 lifetime risk 24.6%   GERD (gastroesophageal reflux disease)    Goiter    History of  chicken pox    Increased risk of breast cancer    IBIS=25%, riskscore=24.6%   Interstitial cystitis    Screening for colon cancer 10/2020   Cologuard NEG; repeat after 3 yrs   Shingles     Past Surgical History:  Procedure Laterality Date   COLPOSCOPY  Mar 08, 1998   LGSIL Pap, mild dysplasia, VAIN 1   DILATION AND CURETTAGE OF UTERUS  08-Mar-2004   due to missed abortion   HYSTEROSCOPY WITH D & C N/A 06/27/2015   Procedure: DILATATION AND CURETTAGE /HYSTEROSCOPY;  Surgeon: Honor Loh Ward, MD;  Location: [facility] ORS;  Service: Gynecology;  Laterality: N/A;    Family History  Problem Relation Age of Onset   Hypertension Mother    Breast cancer Mother 39   Crohn's disease Father    Scoliosis Sister    Crohn's disease Paternal Grandmother    Breast cancer Paternal Grandmother 95   CAD Paternal Grandfather        stent   Breast cancer Cousin 16       2nd cousin paternal side. died in 03-09-03   Colon cancer Paternal Uncle 49   Breast cancer Cousin 13       2nd cousin on paternal side   Hypertension Maternal Aunt    Multiple myeloma Maternal Aunt     Social History   Socioeconomic History   Marital status:  Married    Spouse name: Johnathan   Number of children: 2   Years of education: Not on file   Highest education level: Not on file  Occupational History   Occupation: Journalist, newspaper  Tobacco Use   Smoking status: Never   Smokeless tobacco: Never  Vaping Use   Vaping Use: Never used  Substance and Sexual Activity   Alcohol use: No   Drug use: No   Sexual activity: Yes    Partners: Male    Birth control/protection: Surgical    Comment: vasectomy  Other Topics Concern   Not on file  Social History Narrative   Not on file   Social Determinants of Health   Financial Resource Strain: Not on file  Food Insecurity: Not on file  Transportation Needs: Not on file  Physical Activity: Not on file  Stress: Not on file  Social Connections: Not on file  Intimate Partner Violence:  Not on file     Current Outpatient Medications:    amoxicillin-clavulanate (AUGMENTIN) 875-125 MG tablet, Take 1 tablet by mouth 2 (two) times daily., Disp: 20 tablet, Rfl: 0   Cetirizine HCl 10 MG CAPS, Take 1 tablet by mouth daily., Disp: , Rfl:    famciclovir (FAMVIR) 500 MG tablet, Take 1 tablet (500 mg total) by mouth 3 (three) times daily., Disp: 21 tablet, Rfl: 0   levonorgestrel-ethinyl estradiol (AVIANE) 0.1-20 MG-MCG tablet, Take 1 tablet by mouth daily., Disp: 84 tablet, Rfl: 0   LORazepam (ATIVAN) 0.5 MG tablet, Take 1 tablet (0.5 mg total) by mouth every 8 (eight) hours., Disp: 20 tablet, Rfl: 0   Multiple Vitamins-Minerals (MULTIVITAMIN WITH MINERALS) tablet, Take 1 tablet by mouth daily., Disp: , Rfl:    predniSONE (STERAPRED UNI-PAK 21 TAB) 10 MG (21) TBPK tablet, PO: Take 6 tablets on day 1:Take 5 tablets day 2:Take 4 tablets day 3: Take 3 tablets day 4:Take 2 tablets day five: 5 Take 1 tablet day 6, Disp: 21 tablet, Rfl: 0     ROS:  Review of Systems  Constitutional:  Negative for fatigue, fever and unexpected weight change.  Respiratory:  Negative for cough, shortness of breath and wheezing.   Cardiovascular:  Negative for chest pain, palpitations and leg swelling.  Gastrointestinal:  Negative for blood in stool, constipation, diarrhea, nausea and vomiting.  Endocrine: Negative for cold intolerance, heat intolerance and polyuria.  Genitourinary:  Negative for dyspareunia, dysuria, flank pain, frequency, genital sores, hematuria, menstrual problem, pelvic pain, urgency, vaginal bleeding, vaginal discharge and vaginal pain.  Musculoskeletal:  Positive for arthralgias. Negative for back pain, joint swelling and myalgias.  Skin:  Negative for rash.  Neurological:  Positive for headaches. Negative for dizziness, syncope, light-headedness and numbness.  Hematological:  Negative for adenopathy.  Psychiatric/Behavioral:  Negative for agitation, confusion, sleep disturbance and  suicidal ideas. The patient is not nervous/anxious.    BREAST: No symptoms    Objective: There were no vitals taken for this visit.   Physical Exam Constitutional:      Appearance: She is well-developed.  Genitourinary:     Vulva normal.     Right Labia: No rash, tenderness or lesions.    Left Labia: No tenderness, lesions or rash.    No vaginal discharge, erythema or tenderness.      Right Adnexa: not tender and no mass present.    Left Adnexa: not tender and no mass present.    No cervical friability or polyp.     Uterus is not enlarged  or tender.  Breasts:    Right: No mass, nipple discharge, skin change or tenderness.     Left: No mass, nipple discharge, skin change or tenderness.  Neck:     Thyroid: No thyromegaly.  Cardiovascular:     Rate and Rhythm: Normal rate and regular rhythm.     Heart sounds: Normal heart sounds. No murmur heard. Pulmonary:     Effort: Pulmonary effort is normal.     Breath sounds: Normal breath sounds.  Abdominal:     Palpations: Abdomen is soft.     Tenderness: There is no abdominal tenderness. There is no guarding or rebound.  Musculoskeletal:        General: Normal range of motion.     Cervical back: Normal range of motion.  Lymphadenopathy:     Cervical: No cervical adenopathy.  Neurological:     General: No focal deficit present.     Mental Status: She is alert and oriented to person, place, and time.     Cranial Nerves: No cranial nerve deficit.  Skin:    General: Skin is warm and dry.  Psychiatric:        Mood and Affect: Mood normal.        Behavior: Behavior normal.        Thought Content: Thought content normal.        Judgment: Judgment normal.  Vitals reviewed.     Assessment/Plan:  Encounter for annual routine gynecological examination  Cervical cancer screening - Plan: Cytology - PAP  Screening for HPV (human papillomavirus) - Plan: Cytology - PAP  LGSIL on Pap smear of cervix - Plan: Cytology - PAP; repeat  papsQ3 yrs.   Encounter for surveillance of contraceptive pills - Plan: norethindrone (MICRONOR) 0.35 MG tablet; Rx RF. F/u prn.   Encounter for screening mammogram for malignant neoplasm of breast - Plan: MM 3D SCREEN BREAST BILATERAL; pt to [facility] mammo  Family history of breast cancer - Plan: MM 3D SCREEN BREAST BILATERAL; Pt is MyRisk neg.  Increased risk of breast cancer - Plan: MM 3D SCREEN BREAST BILATERAL; recommended monthly SBE, yearly CBE and mammos; also scr breast MRI. Pt to call for ref prn. Add Vit D supp.   No orders of the defined types were placed in this encounter.           GYN counsel breast self exam, mammography screening, adequate intake of calcium and vitamin D, diet and exercise    F/U  No follow-ups on file.  Akacia Boltz B. Chantavia Bazzle, PA-C 11/30/2022 1:38 PM

## 2022-12-02 ENCOUNTER — Ambulatory Visit (INDEPENDENT_AMBULATORY_CARE_PROVIDER_SITE_OTHER): Payer: BC Managed Care – PPO | Admitting: Obstetrics and Gynecology

## 2022-12-02 ENCOUNTER — Encounter: Payer: Self-pay | Admitting: Obstetrics and Gynecology

## 2022-12-02 VITALS — BP 120/80 | Ht 67.0 in | Wt 202.0 lb

## 2022-12-02 DIAGNOSIS — Z9189 Other specified personal risk factors, not elsewhere classified: Secondary | ICD-10-CM

## 2022-12-02 DIAGNOSIS — Z803 Family history of malignant neoplasm of breast: Secondary | ICD-10-CM

## 2022-12-02 DIAGNOSIS — R002 Palpitations: Secondary | ICD-10-CM

## 2022-12-02 DIAGNOSIS — Z3041 Encounter for surveillance of contraceptive pills: Secondary | ICD-10-CM

## 2022-12-02 DIAGNOSIS — Z01419 Encounter for gynecological examination (general) (routine) without abnormal findings: Secondary | ICD-10-CM

## 2022-12-02 DIAGNOSIS — Z1231 Encounter for screening mammogram for malignant neoplasm of breast: Secondary | ICD-10-CM

## 2022-12-02 MED ORDER — LEVONORGESTREL-ETHINYL ESTRAD 0.1-20 MG-MCG PO TABS: 1.0000 | ORAL_TABLET | Freq: Every day | ORAL | 3 refills | Status: DC

## 2022-12-02 NOTE — Patient Instructions (Signed)
I value your feedback and you entrusting us with your care. If you get a [place] patient survey, I would appreciate you taking the time to let us know about your experience today. Thank you!  [facility]: [phone]      

## 2023-01-06 ENCOUNTER — Ambulatory Visit
Admission: RE | Admit: 2023-01-06 | Discharge: 2023-01-06 | Disposition: A | Discharge status: Home / Self Care | Payer: BC Managed Care – PPO | Source: Ambulatory Visit | Attending: Obstetrics and Gynecology | Admitting: Obstetrics and Gynecology

## 2023-01-06 DIAGNOSIS — Z9189 Other specified personal risk factors, not elsewhere classified: Secondary | ICD-10-CM | POA: Insufficient documentation

## 2023-01-06 DIAGNOSIS — Z803 Family history of malignant neoplasm of breast: Secondary | ICD-10-CM | POA: Insufficient documentation

## 2023-01-06 DIAGNOSIS — Z1231 Encounter for screening mammogram for malignant neoplasm of breast: Secondary | ICD-10-CM | POA: Insufficient documentation

## 2024-02-07 NOTE — Progress Notes (Unsigned)
 PCP: Chrismon, Jodell Cipro, PA-C (Inactive)   No chief complaint on file.   HPI:      Amanda Coffey is a 48 y.o. [id number] whose LMP was No LMP recorded., presents today for her annual examination.  Her menses are regular every 4 wks on OCPs (changed from POPs 6/23), lasting 4 days, no BTB, no dysmen but does get occas LLQ pains 1 wk before menses. Was on POPs but cycles were Q3 wks. Pt did POPs due to FH breast cancer. Vasomotor sx improved with estrogen OCPs but worsened when pharm changed generic brand this month. No hx of HTN, DVTs, migraines with aura.   Sex activity: single partner, contraception - vasectomy. She does not have vaginal dryness/pain/bleeding  Last Pap: 11/19/21  Results were: no abnormalities /neg HPV DNA. Due Q3 yrs.  Hx of STDs: HPV--VAIN 1 and LGSIL 1999  Last mammogram: 01/06/23 Results were: normal--routine follow-up in 12 months There is FH of breast cancer in her mother, paternal second cousins x 2, and paternal grandmother. There is no FH of ovarian cancer. Pt is MyRisk neg 2019; IBIS=25%, riskscore=24.6%. The patient does not do self-breast exams. Has not had scr breast MRI. She is taking Vit D supp.   Colonoscopy: never; neg Cologuard 1/22, repeat after 3 yrs  Tobacco use: The patient denies current or previous tobacco use. Alcohol use: none  No drug use Exercise: not active  She does get adequate calcium and Vitamin D in her diet.  Labs with endocrine. Had heart flutters/palpitations over the holidays when taking sudafed, no sx since. Has had them occas in th past but more frequent when on sudafed.  Past Medical History:  Diagnosis Date   Abnormal Pap smear of cervix 06-Mar-1998   LGSIL   BRCA negative Mar 06, 2012; 2/19   BRCA neg; MyRisk neg   Family history of breast cancer    has tested negative for HBOC in 2013/ negative Myrisk test 2019/ lifetime risk 24.6%   GERD (gastroesophageal reflux disease)    Goiter    History of chicken pox    Increased  risk of breast cancer    IBIS=25%, riskscore=24.6%   Interstitial cystitis    Screening for colon cancer 10/2020   Cologuard NEG; repeat after 3 yrs   Shingles     Past Surgical History:  Procedure Laterality Date   COLPOSCOPY  Mar 06, 1998   LGSIL Pap, mild dysplasia, VAIN 1   DILATION AND CURETTAGE OF UTERUS  2005   due to missed abortion   HYSTEROSCOPY WITH D & C N/A 06/27/2015   Procedure: DILATATION AND CURETTAGE /HYSTEROSCOPY;  Surgeon: Elenora Fender Ward, MD;  Location: [facility] OR[facility];  Service: Gynecology;  Laterality: N/A;    Family History  Problem Relation Age of Onset   Hypertension Mother    Breast cancer Mother 67   Crohn's disease Father    Scoliosis Sister    Crohn's disease Paternal Grandmother    Breast cancer Paternal Grandmother 33   CAD Paternal Grandfather        stent   Breast cancer Cousin 61       2nd cousin paternal side. died in 03-07-03   Colon cancer Paternal Uncle 64   Breast cancer Cousin 54       2nd cousin on paternal side   Hypertension Maternal Aunt    Multiple myeloma Maternal Aunt     Social History   Socioeconomic History   Marital status: Married    Spouse name:  Johnathan   Number of children: 2   Years of education: Not on file   Highest education level: Not on file  Occupational History   Occupation: Journalist, newspaper  Tobacco Use   Smoking status: Never   Smokeless tobacco: Never  Vaping Use   Vaping status: Never Used  Substance and Sexual Activity   Alcohol use: No   Drug use: No   Sexual activity: Yes    Partners: Male    Birth control/protection: Surgical    Comment: vasectomy  Other Topics Concern   Not on file  Social History Narrative   Not on file   Social Drivers of Health   Financial Resource Strain: Not on file  Food Insecurity: Not on file  Transportation Needs: Not on file  Physical Activity: Not on file  Stress: Not on file  Social Connections: Not on file  Intimate Partner Violence: Not on file     Current  Outpatient Medications:    Cetirizine HCl 10 MG CAPS, Take 1 tablet by mouth daily., Disp: , Rfl:    levonorgestrel-ethinyl estradiol (AVIANE) 0.1-20 MG-MCG tablet, Take 1 tablet by mouth daily., Disp: 84 tablet, Rfl: 3   LORazepam (ATIVAN) 0.5 MG tablet, Take 1 tablet (0.5 mg total) by mouth every 8 (eight) hours. (Patient not taking: Reported on 12/02/2022), Disp: 20 tablet, Rfl: 0   Multiple Vitamins-Minerals (MULTIVITAMIN WITH MINERALS) tablet, Take 1 tablet by mouth daily., Disp: , Rfl:      ROS:  Review of Systems  Constitutional:  Negative for fatigue, fever and unexpected weight change.  Respiratory:  Negative for cough, shortness of breath and wheezing.   Cardiovascular:  Negative for chest pain, palpitations and leg swelling.  Gastrointestinal:  Negative for blood in stool, constipation, diarrhea, nausea and vomiting.  Endocrine: Negative for cold intolerance, heat intolerance and polyuria.  Genitourinary:  Negative for dyspareunia, dysuria, flank pain, frequency, genital sores, hematuria, menstrual problem, pelvic pain, urgency, vaginal bleeding, vaginal discharge and vaginal pain.  Musculoskeletal:  Positive for arthralgias. Negative for back pain, joint swelling and myalgias.  Skin:  Negative for rash.  Neurological:  Positive for headaches. Negative for dizziness, syncope, light-headedness and numbness.  Hematological:  Negative for adenopathy.  Psychiatric/Behavioral:  Negative for agitation, confusion, sleep disturbance and suicidal ideas. The patient is not nervous/anxious.    BREAST: No symptoms    Objective: There were no vitals taken for this visit.   Physical Exam Constitutional:      Appearance: She is well-developed.  Genitourinary:     Vulva normal.     Right Labia: No rash, tenderness or lesions.    Left Labia: No tenderness, lesions or rash.    No vaginal discharge, erythema or tenderness.      Right Adnexa: not tender and no mass present.    Left  Adnexa: not tender and no mass present.    No cervical friability or polyp.     Uterus is not enlarged or tender.  Breasts:    Right: No mass, nipple discharge, skin change or tenderness.     Left: No mass, nipple discharge, skin change or tenderness.  Neck:     Thyroid: No thyromegaly.  Cardiovascular:     Rate and Rhythm: Normal rate and regular rhythm.     Heart sounds: Normal heart sounds. No murmur heard. Pulmonary:     Effort: Pulmonary effort is normal.     Breath sounds: Normal breath sounds.  Abdominal:     Palpations: Abdomen is  soft.     Tenderness: There is no abdominal tenderness. There is no guarding or rebound.  Musculoskeletal:        General: Normal range of motion.     Cervical back: Normal range of motion.  Lymphadenopathy:     Cervical: No cervical adenopathy.  Neurological:     General: No focal deficit present.     Mental Status: She is alert and oriented to person, place, and time.     Cranial Nerves: No cranial nerve deficit.  Skin:    General: Skin is warm and dry.  Psychiatric:        Mood and Affect: Mood normal.        Behavior: Behavior normal.        Thought Content: Thought content normal.        Judgment: Judgment normal.  Vitals reviewed.     Assessment/Plan:  Encounter for annual routine gynecological examination  Encounter for surveillance of contraceptive pills - Plan: levonorgestrel-ethinyl estradiol (AVIANE) 0.1-20 MG-MCG tablet; OCP RF eRxd. Pt to tell pharmm which generic she prefers.  Encounter for screening mammogram for malignant neoplasm of breast - Plan: MM 3D SCREEN BREAST BILATERAL; pt to schedule mammo  Family history of breast cancer - Plan: MM 3D SCREEN BREAST BILATERAL; Pt is MyRisk neg.  Increased risk of breast cancer - Plan: MM 3D SCREEN BREAST BILATERAL; recommended monthly SBE, yearly CBE and mammos; also scr breast MRI. Pt to call for ref prn. Cont Vit D supp.  Heart palpitations--f/u with PCP for further eval  if sx persist. Most likely PVCs related to sudafed use.    No orders of the defined types were placed in this encounter.           GYN counsel breast self exam, mammography screening, adequate intake of calcium and vitamin D, diet and exercise    F/U  No follow-ups on file.  Melora Menon B. Dama Hedgepeth, PA-C 02/07/2024 4:32 PM

## 2024-02-09 ENCOUNTER — Encounter: Payer: Self-pay | Admitting: Obstetrics and Gynecology

## 2024-02-09 ENCOUNTER — Other Ambulatory Visit: Payer: Self-pay | Admitting: Obstetrics and Gynecology

## 2024-02-09 ENCOUNTER — Ambulatory Visit (INDEPENDENT_AMBULATORY_CARE_PROVIDER_SITE_OTHER): Payer: BC Managed Care – PPO | Admitting: Obstetrics and Gynecology

## 2024-02-09 VITALS — BP 108/74 | Ht 67.0 in | Wt 197.0 lb

## 2024-02-09 DIAGNOSIS — N9089 Other specified noninflammatory disorders of vulva and perineum: Secondary | ICD-10-CM

## 2024-02-09 DIAGNOSIS — Z9189 Other specified personal risk factors, not elsewhere classified: Secondary | ICD-10-CM

## 2024-02-09 DIAGNOSIS — Z131 Encounter for screening for diabetes mellitus: Secondary | ICD-10-CM

## 2024-02-09 DIAGNOSIS — Z1231 Encounter for screening mammogram for malignant neoplasm of breast: Secondary | ICD-10-CM

## 2024-02-09 DIAGNOSIS — Z1211 Encounter for screening for malignant neoplasm of colon: Secondary | ICD-10-CM

## 2024-02-09 DIAGNOSIS — Z0000 Encounter for general adult medical examination without abnormal findings: Secondary | ICD-10-CM

## 2024-02-09 DIAGNOSIS — Z3041 Encounter for surveillance of contraceptive pills: Secondary | ICD-10-CM

## 2024-02-09 DIAGNOSIS — Z01419 Encounter for gynecological examination (general) (routine) without abnormal findings: Secondary | ICD-10-CM

## 2024-02-09 DIAGNOSIS — Z13220 Encounter for screening for lipoid disorders: Secondary | ICD-10-CM

## 2024-02-09 DIAGNOSIS — Z803 Family history of malignant neoplasm of breast: Secondary | ICD-10-CM

## 2024-02-09 MED ORDER — LEVONORGESTREL-ETHINYL ESTRAD 0.1-20 MG-MCG PO TABS: 1.0000 | ORAL_TABLET | Freq: Every day | ORAL | 3 refills | Status: DC

## 2024-02-09 NOTE — Patient Instructions (Addendum)
 I value your feedback and you entrusting [place] with your care. If you get a [facility] patient survey, I would appreciate you taking the time to let us know about your experience today. Thank you!  [facility] ([place])--[phone]

## 2024-02-10 ENCOUNTER — Other Ambulatory Visit: Payer: Self-pay

## 2024-02-10 ENCOUNTER — Telehealth: Payer: Self-pay

## 2024-02-10 ENCOUNTER — Other Ambulatory Visit

## 2024-02-10 ENCOUNTER — Ambulatory Visit
Admission: RE | Admit: 2024-02-10 | Discharge: 2024-02-10 | Disposition: A | Discharge status: Home / Self Care | Source: Ambulatory Visit | Attending: Obstetrics and Gynecology | Admitting: Obstetrics and Gynecology

## 2024-02-10 DIAGNOSIS — Z0000 Encounter for general adult medical examination without abnormal findings: Secondary | ICD-10-CM

## 2024-02-10 DIAGNOSIS — Z1231 Encounter for screening mammogram for malignant neoplasm of breast: Secondary | ICD-10-CM | POA: Insufficient documentation

## 2024-02-10 DIAGNOSIS — Z1211 Encounter for screening for malignant neoplasm of colon: Secondary | ICD-10-CM

## 2024-02-10 DIAGNOSIS — Z131 Encounter for screening for diabetes mellitus: Secondary | ICD-10-CM

## 2024-02-10 DIAGNOSIS — Z13220 Encounter for screening for lipoid disorders: Secondary | ICD-10-CM

## 2024-02-10 MED ORDER — SUTAB 1479-225-188 MG PO TABS: 12.0000 | ORAL_TABLET | Freq: Two times a day (BID) | ORAL | 0 refills | Status: AC

## 2024-02-10 NOTE — Telephone Encounter (Signed)
 Gastroenterology Pre-Procedure Review  Request Date: 02/28/24 Requesting Physician: Dr. Servando Snare  PATIENT REVIEW QUESTIONS: The patient responded to the following health history questions as indicated:    1. Are you having any GI issues? no 2. Do you have a personal history of Polyps? no 3. Do you have a family history of Colon Cancer or Polyps? no 4. Diabetes Mellitus? no 5. Joint replacements in the past 12 months?no 6. Major health problems in the past 3 months?no 7. Any artificial heart valves, MVP, or defibrillator?no    MEDICATIONS & ALLERGIES:    Patient reports the following regarding taking any anticoagulation/antiplatelet therapy:   Plavix, Coumadin, Eliquis, Xarelto, Lovenox, Pradaxa, Brilinta, or Effient? no Aspirin? no  Patient confirms/reports the following medications:  Current Outpatient Medications  Medication Sig Dispense Refill   Sodium Sulfate-Mag Sulfate-KCl (SUTAB) [phone] MG TABS Take 12 tablets by mouth 2 (two) times daily for 1 day. 24 tablet 0   Levocetirizine Dihydrochloride (XYZAL PO) Take by mouth.     levonorgestrel-ethinyl estradiol (AVIANE) 0.1-20 MG-MCG tablet Take 1 tablet by mouth daily. 84 tablet 3   Multiple Vitamins-Minerals (MULTIVITAMIN WITH MINERALS) tablet Take 1 tablet by mouth daily.     No current facility-administered medications for this visit.    Patient confirms/reports the following allergies:  No Known Allergies  No orders of the defined types were placed in this encounter.   AUTHORIZATION INFORMATION Primary Insurance: 1D#: Group #:  Secondary Insurance: 1D#: Group #:  SCHEDULE INFORMATION: Date: 02/28/24 Time: Location: [facility]

## 2024-02-11 ENCOUNTER — Encounter: Payer: Self-pay | Admitting: Obstetrics and Gynecology

## 2024-02-11 LAB — COMPREHENSIVE METABOLIC PANEL
ALT: 28 IU/L (ref 0–32)
AST: 18 IU/L (ref 0–40)
Albumin: 4.1 g/dL (ref 3.9–4.9)
Alkaline Phosphatase: 83 IU/L (ref 44–121)
BUN/Creatinine Ratio: 18 (ref 9–23)
BUN: 13 mg/dL (ref 6–24)
Bilirubin Total: 0.4 mg/dL (ref 0.0–1.2)
CO2: 22 mmol/L (ref 20–29)
Calcium: 9.2 mg/dL (ref 8.7–10.2)
Chloride: 105 mmol/L (ref 96–106)
Creatinine, Ser: 0.74 mg/dL (ref 0.57–1.00)
Globulin, Total: 2.2 g/dL (ref 1.5–4.5)
Glucose: 90 mg/dL (ref 70–99)
Potassium: 4 mmol/L (ref 3.5–5.2)
Sodium: 140 mmol/L (ref 134–144)
Total Protein: 6.3 g/dL (ref 6.0–8.5)
eGFR: 100 mL/min/{1.73_m2} (ref >59)

## 2024-02-11 LAB — LIPID PANEL
Chol/HDL Ratio: 2 ratio (ref 0.0–4.4)
Cholesterol, Total: 100 mg/dL (ref 100–199)
HDL: 49 mg/dL (ref >39)
LDL Chol Calc (NIH): 38 mg/dL (ref 0–99)
Triglycerides: 54 mg/dL (ref 0–149)
VLDL Cholesterol Cal: 13 mg/dL (ref 5–40)

## 2024-02-11 LAB — HEMOGLOBIN A1C
Est. average glucose Bld gHb Est-mCnc: 114 mg/dL
Hgb A1c MFr Bld: 5.6 % (ref 4.8–5.6)

## 2024-02-13 ENCOUNTER — Encounter: Payer: Self-pay | Admitting: Obstetrics and Gynecology

## 2024-02-14 ENCOUNTER — Other Ambulatory Visit: Payer: Self-pay | Admitting: Obstetrics and Gynecology

## 2024-02-14 DIAGNOSIS — R928 Other abnormal and inconclusive findings on diagnostic imaging of breast: Secondary | ICD-10-CM

## 2024-02-15 ENCOUNTER — Other Ambulatory Visit: Payer: Self-pay | Admitting: Obstetrics and Gynecology

## 2024-02-15 ENCOUNTER — Ambulatory Visit
Admission: RE | Admit: 2024-02-15 | Discharge: 2024-02-15 | Disposition: A | Discharge status: Home / Self Care | Source: Ambulatory Visit | Attending: Obstetrics and Gynecology | Admitting: Obstetrics and Gynecology

## 2024-02-15 ENCOUNTER — Ambulatory Visit

## 2024-02-15 DIAGNOSIS — R928 Other abnormal and inconclusive findings on diagnostic imaging of breast: Secondary | ICD-10-CM | POA: Diagnosis present

## 2024-02-15 DIAGNOSIS — R799 Abnormal finding of blood chemistry, unspecified: Secondary | ICD-10-CM | POA: Diagnosis present

## 2024-02-15 DIAGNOSIS — C50411 Malignant neoplasm of upper-outer quadrant of right female breast: Secondary | ICD-10-CM | POA: Diagnosis present

## 2024-02-15 HISTORY — PX: BREAST BIOPSY: SHX20

## 2024-02-15 MED ORDER — LIDOCAINE 1 % OPTIME INJ - NO CHARGE
2.0000 mL | Freq: Once | INTRAMUSCULAR | Status: AC
  Administered 2024-02-15: 2 mL
  Filled 2024-02-15: qty 2

## 2024-02-15 MED ORDER — LIDOCAINE-EPINEPHRINE 1 %-1:100000 IJ SOLN
8.0000 mL | Freq: Once | INTRAMUSCULAR | Status: AC
  Administered 2024-02-15: 8 mL
  Filled 2024-02-15: qty 8

## 2024-02-16 LAB — SURGICAL PATHOLOGY

## 2024-02-17 ENCOUNTER — Encounter

## 2024-02-17 ENCOUNTER — Encounter: Payer: Self-pay | Admitting: *Deleted

## 2024-02-17 ENCOUNTER — Other Ambulatory Visit

## 2024-02-17 DIAGNOSIS — C50919 Malignant neoplasm of unspecified site of unspecified female breast: Secondary | ICD-10-CM

## 2024-02-17 NOTE — Progress Notes (Signed)
 Received referral for newly diagnosed breast cancer from [facility].  Navigation initiated.  She will see Dr. Hazle Quant and Dr. Orlie Dakin  on Tuesday 02/21/24.

## 2024-02-21 ENCOUNTER — Other Ambulatory Visit: Payer: Self-pay | Admitting: General Surgery

## 2024-02-21 ENCOUNTER — Ambulatory Visit: Payer: Self-pay | Admitting: General Surgery

## 2024-02-21 ENCOUNTER — Inpatient Hospital Stay: Attending: Oncology | Admitting: Oncology

## 2024-02-21 ENCOUNTER — Inpatient Hospital Stay

## 2024-02-21 ENCOUNTER — Encounter: Payer: Self-pay | Admitting: *Deleted

## 2024-02-21 ENCOUNTER — Encounter: Payer: Self-pay | Admitting: Oncology

## 2024-02-21 VITALS — BP 141/96 | HR 87 | Temp 97.6°F | Resp 20 | Wt 199.2 lb

## 2024-02-21 DIAGNOSIS — Z510 Encounter for antineoplastic radiation therapy: Secondary | ICD-10-CM | POA: Insufficient documentation

## 2024-02-21 DIAGNOSIS — C50411 Malignant neoplasm of upper-outer quadrant of right female breast: Secondary | ICD-10-CM | POA: Diagnosis present

## 2024-02-21 DIAGNOSIS — K219 Gastro-esophageal reflux disease without esophagitis: Secondary | ICD-10-CM | POA: Insufficient documentation

## 2024-02-21 DIAGNOSIS — B029 Zoster without complications: Secondary | ICD-10-CM | POA: Diagnosis not present

## 2024-02-21 DIAGNOSIS — Z807 Family history of other malignant neoplasms of lymphoid, hematopoietic and related tissues: Secondary | ICD-10-CM | POA: Diagnosis not present

## 2024-02-21 DIAGNOSIS — N3010 Interstitial cystitis (chronic) without hematuria: Secondary | ICD-10-CM | POA: Insufficient documentation

## 2024-02-21 DIAGNOSIS — Z9189 Other specified personal risk factors, not elsewhere classified: Secondary | ICD-10-CM

## 2024-02-21 DIAGNOSIS — Z803 Family history of malignant neoplasm of breast: Secondary | ICD-10-CM | POA: Diagnosis not present

## 2024-02-21 DIAGNOSIS — Z800 Family history of malignant neoplasm of digestive organs: Secondary | ICD-10-CM | POA: Diagnosis not present

## 2024-02-21 DIAGNOSIS — C50911 Malignant neoplasm of unspecified site of right female breast: Secondary | ICD-10-CM | POA: Diagnosis not present

## 2024-02-21 DIAGNOSIS — Z170 Estrogen receptor positive status [ER+]: Secondary | ICD-10-CM

## 2024-02-21 DIAGNOSIS — C50919 Malignant neoplasm of unspecified site of unspecified female breast: Secondary | ICD-10-CM

## 2024-02-21 NOTE — H&P (Signed)
 PATIENT PROFILE: Amanda Coffey is a 48 y.o. female who presents to the Clinic for consultation at the request of [place], [place] for evaluation of right breast cancer.  PCP:  Rica Records, PA  History of Present Illness Amanda Coffey is a 48 year old female who presents for evaluation of right breast cancer.  She underwent a routine bilateral screening mammogram on February 10, 2024, which suggested a possible abnormality in the right breast. A follow-up diagnostic mammogram and breast ultrasound on February 15, 2024, confirmed a 7 mm mass located at the 11 o'clock position, 5 cm from the nipple of the right breast. 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 No prior history of breast abnormalities or biopsies was noted, and she did not experience any masses, skin changes, or discharge prior to the screening mammogram. She has been BRCA tested twice with negative results.  No significant issues were reported following the biopsy, which was performed last Wednesday. She denies any bruising or major complications.  Her family history is significant for breast cancer, with multiple family members affected, including two aunts. Her mother had uterine cancer. No other family members have been tested for BRCA mutations.  She recalls her first menstrual cycle occurring in seventh grade, approximately at age 55, and her last menstrual period was last week. She has used birth control for several years and has had three pregnancies, resulting in two births.  Used estrogen and progesterone therapy: None History of Radiation to the chest: None Previous breast biopsies: None  PROBLEM LIST: Breast cancer  GENERAL REVIEW OF SYSTEMS:   General ROS: negative for - chills, fatigue, fever, weight gain or weight loss Allergy and Immunology ROS: negative for - hives  Hematological and Lymphatic ROS: negative for - bleeding problems  or bruising, negative for palpable nodes Endocrine ROS: negative for - heat or cold intolerance, hair changes Respiratory ROS: negative for - cough, shortness of breath or wheezing Cardiovascular ROS: no chest pain or palpitations GI ROS: negative for nausea, vomiting, abdominal pain, diarrhea, constipation Musculoskeletal ROS: negative for - joint swelling or muscle pain Neurological ROS: negative for - confusion, syncope Dermatological ROS: negative for pruritus and rash Psychiatric: negative for anxiety, depression, difficulty sleeping and memory loss  MEDICATIONS: No current outpatient medications on file.   No current facility-administered medications for this visit.   Levocetirizine Dihydrochloride (XYZAL PO)  levonorgestrel-ethinyl estradiol (AVIANE) 0.1-20 MG-MCG tablet Multiple Vitamins-Minerals (MULTIVITAMIN WITH MINERALS) tablet  ALLERGIES: Patient has no known allergies.  PAST MEDICAL HISTORY: Past Medical History: Diagnosis Date  Abnormal Pap smear of cervix 1999   LGSIL  BRCA negative 2013; 2/19   BRCA neg; MyRisk neg  Family history of breast cancer     has tested negative for HBOC in 2013/ negative Myrisk test 2019/ lifetime risk 24.6%  GERD (gastroesophageal reflux disease)    Goiter    History of chicken pox    Increased risk of breast cancer     IBIS=25%, riskscore=24.6%  Interstitial cystitis    Screening for colon cancer 10/2020   Cologuard NEG; repeat after 3 yrs  Shingles     PAST SURGICAL HISTORY: History reviewed. No pertinent surgical history.   FAMILY HISTORY: No family history on file.   SOCIAL HISTORY: Social History   Socioeconomic History   Marital status: Married  Tobacco Use   Smoking status: Never   Smokeless tobacco: Never  Vaping Use  Vaping status: Never Used  Substance and Sexual Activity   Alcohol use: Never   Drug use: Never   Social Drivers of Corporate investment banker Strain: Low  Risk  (02/21/2024)   Overall Financial Resource Strain (CARDIA)    Difficulty of Paying Living Expenses: Not hard at all  Food Insecurity: No Food Insecurity (02/21/2024)   Hunger Vital Sign    Worried About Running Out of Food in the Last Year: Never true    Ran Out of Food in the Last Year: Never true  Transportation Needs: No Transportation Needs (02/21/2024)   PRAPARE - Administrator, Civil Service (Medical): No    Lack of Transportation (Non-Medical): No    PHYSICAL EXAM: Vitals:   02/21/24 1334  BP: (!) 150/95  Pulse: 80   Body mass index is 29.76 kg/m. Weight: 86.2 kg (190 lb)   GENERAL: Alert, active, oriented x3  HEENT: Pupils equal reactive to light. Extraocular movements are intact. Sclera clear. Palpebral conjunctiva normal red color.Pharynx clear.  NECK: Supple with no palpable mass and no adenopathy.  LUNGS: Sound clear with no rales rhonchi or wheezes.  HEART: Regular rhythm S1 and S2 without murmur.  BREAST: breasts appear normal, no suspicious masses, no skin or nipple changes or axillary nodes.  ABDOMEN: Soft and depressible, nontender with no palpable mass, no hepatomegaly.  EXTREMITIES: Well-developed well-nourished symmetrical with no dependent edema.  NEUROLOGICAL: Awake alert oriented, facial expression symmetrical, moving all extremities.  REVIEW OF DATA: I have reviewed the following data today: No results found for any previous visit.     Results RADIOLOGY (I personally evaluated this images) Screening mammogram: Suggested possible assertion of the right breast (02/10/2024) Diagnostic mammogram: Confirmed 7 mm mass at 11 o'clock, 5 cm from the nipple of the right breast (02/15/2024) Breast ultrasound: Confirmed 7 mm mass at 11 o'clock, 5 cm from the nipple of the right breast (02/15/2024)  PATHOLOGY Biopsy: Invasive mammary carcinoma, ER positive, PR positive, HER2 negative (02/15/2024)  Assessment & Plan Right breast cancer   A  7 mm invasive mammary carcinoma is present in the right breast at the 11 o'clock position, 5 cm from the nipple. The tumor is ER positive, PR positive, and HER2 negative. There is a significant family history of breast cancer, but she is negative for the BRCA gene. She prefers lumpectomy over mastectomy due to the tumor's size and characteristics, which offer the same cure rate. Surgical options, including lumpectomy with sentinel lymph node biopsy, have been explained. Radiation and endocrine therapy may be needed, but chemotherapy is not necessary due to the tumor's size and pathology. The lumpectomy will involve removing the tumor with negative margins and performing a sentinel lymph node biopsy to check for metastasis. This is a same-day surgery with recovery expected in a couple of weeks. Post-surgery swelling and discomfort will be managed with a sports bra, ice packs, and pain medication. Schedule lumpectomy with sentinel lymph node biopsy for Monday. Coordinate with Dr. Orlie Dakin for further discussion of adjuvant radiation and endocrine therapy. Advise use of a sports bra post-surgery for comfort and instruct on ice pack use for swelling. Plan a follow-up appointment in two weeks post-surgery.  Patient was oriented again about the pathology results. Surgical alternatives were discussed with patient including partial vs total mastectomy. Surgical technique and post operative care was discussed with patient. Risk of surgery was discussed with patient including but not limited to: wound infection, seroma, hematoma, brachial plexopathy, mondor's disease (  thrombosis of small veins of breast), chronic wound pain, breast lymphedema, altered sensation to the nipple and cosmesis among others.   Malignant neoplasm of upper-outer quadrant of right breast in female, estrogen receptor positive (CMS/HHS-HCC) [C50.411, Z17.0]  Patient and her husband verbalized understanding, all questions were answered, and were  agreeable with the plan outlined above.   Carolan Shiver, MD  Electronically signed by Carolan Shiver, MD

## 2024-02-21 NOTE — Progress Notes (Unsigned)
 Amanda Coffey  [phone]) [phone] Fax:(336) [phone]  ID: Amanda Coffey OB: 1975/11/26  MR#: [id number]  DGL#:[id number]  Patient Care Team: Patient, No Pcp Per as PCP - General (General Practice) Hulen Luster, RN as Oncology Nurse Navigator Orlie Dakin, Tollie Pizza, MD as Consulting Physician (Oncology)  CHIEF COMPLAINT: Clinical stage Ia ER/PR positive, HER2 negative invasive carcinoma of the right breast.  INTERVAL HISTORY: Patient is a 48 year old female who had been getting regular breast exams on a yearly basis who recently underwent routine screening mammogram and subsequent biopsy that revealed the above-stated breast cancer.  She currently feels well and is asymptomatic.  She has no neurologic complaints.  She denies any recent fevers or illnesses.  She has a good appetite and denies weight loss.  She has no chest pain, shortness of breath, cough, or hemoptysis.  She denies any nausea, vomiting, constipation, or diarrhea.  She has no urinary complaints.  Patient offers no specific complaints today.  REVIEW OF SYSTEMS:   Review of Systems  Constitutional: Negative.  Negative for fever, malaise/fatigue and weight loss.  Respiratory: Negative.  Negative for cough, hemoptysis and shortness of breath.   Cardiovascular: Negative.  Negative for chest pain and leg swelling.  Gastrointestinal: Negative.  Negative for abdominal pain.  Genitourinary: Negative.  Negative for dysuria.  Musculoskeletal: Negative.  Negative for back pain.  Skin: Negative.  Negative for rash.  Neurological: Negative.  Negative for dizziness, focal weakness, weakness and headaches.  Psychiatric/Behavioral: Negative.  The patient is not nervous/anxious.     As per HPI. Otherwise, a complete review of systems is negative.  PAST MEDICAL HISTORY: Past Medical History:  Diagnosis Date   Abnormal Pap smear of cervix 27-Mar-1998   LGSIL   BRCA negative 03-27-12; 2/19   BRCA neg; MyRisk neg   Family  history of breast cancer    has tested negative for HBOC in 2013/ negative Myrisk test 2019/ lifetime risk 24.6%   GERD (gastroesophageal reflux disease)    Goiter    History of chicken pox    Increased risk of breast cancer    IBIS=25%, riskscore=24.6%   Interstitial cystitis    Screening for colon cancer 10/2020   Cologuard NEG; repeat after 3 yrs   Shingles     PAST SURGICAL HISTORY: Past Surgical History:  Procedure Laterality Date   BREAST BIOPSY Right 02/15/2024   [place] Bx, ribbon clip, path pending   BREAST BIOPSY Right 02/15/2024   [place] RT BREAST BX W LOC DEV 1ST LESION IMG BX SPEC US GUIDE 02/15/2024 [facility]   COLPOSCOPY  1999   LGSIL Pap, mild dysplasia, VAIN 1   DILATION AND CURETTAGE OF UTERUS  2005   due to missed abortion   HYSTEROSCOPY WITH D & C N/A 06/27/2015   Procedure: DILATATION AND CURETTAGE /HYSTEROSCOPY;  Surgeon: Elenora Fender Ward, MD;  Location: [facility];  Service: Gynecology;  Laterality: N/A;    FAMILY HISTORY: Family History  Problem Relation Age of Onset   Hypertension Mother    Breast cancer Mother 67   Crohn's disease Father    Scoliosis Sister    Crohn's disease Paternal Grandmother    Breast cancer Paternal Grandmother 56   CAD Paternal Grandfather        stent   Breast cancer Cousin 35       2nd cousin paternal side. died in 2003/03/28   Colon cancer Paternal Uncle 79   Breast cancer Cousin 3  2nd cousin on paternal side   Hypertension Maternal Aunt    Multiple myeloma Maternal Aunt     ADVANCED DIRECTIVES (Y/N):  N  HEALTH MAINTENANCE: Social History   Tobacco Use   Smoking status: Never   Smokeless tobacco: Never  Vaping Use   Vaping status: Never Used  Substance Use Topics   Alcohol use: No   Drug use: No     Colonoscopy:  PAP:  Bone density:  Lipid panel:  No Known Allergies  Current Outpatient Medications  Medication Sig Dispense Refill   Multiple Vitamins-Minerals (MULTIVITAMIN WITH MINERALS) tablet Take 1  tablet by mouth in the morning.     ibuprofen (ADVIL) 200 MG tablet Take 400 mg by mouth every 8 (eight) hours as needed (headaches.).     levocetirizine (XYZAL) 5 MG tablet Take 5 mg by mouth every evening.     No current facility-administered medications for this visit.    OBJECTIVE: Vitals:   02/21/24 1503  BP: (!) 141/96  Pulse: 87  Resp: 20  Temp: 97.6 F (36.4 C)  SpO2: 100%     Body mass index is 31.2 kg/m.    ECOG FS:0 - Asymptomatic  General: Well-developed, well-nourished, no acute distress. Eyes: Pink conjunctiva, anicteric sclera. HEENT: Normocephalic, moist mucous membranes. Breasts: Exam deferred today. Lungs: No audible wheezing or coughing. Heart: Regular rate and rhythm. Abdomen: Soft, nontender, no obvious distention. Musculoskeletal: No edema, cyanosis, or clubbing. Neuro: Alert, answering all questions appropriately. Cranial nerves grossly intact. Skin: No rashes or petechiae noted. Psych: Normal affect. Lymphatics: No cervical, calvicular, axillary or inguinal LAD.   LAB RESULTS:  Lab Results  Component Value Date   NA 140 02/10/2024   K 4.0 02/10/2024   CL 105 02/10/2024   CO2 22 02/10/2024   GLUCOSE 90 02/10/2024   BUN 13 02/10/2024   CREATININE 0.74 02/10/2024   CALCIUM 9.2 02/10/2024   PROT 6.3 02/10/2024   ALBUMIN 4.1 02/10/2024   AST 18 02/10/2024   ALT 28 02/10/2024   ALKPHOS 83 02/10/2024   BILITOT 0.4 02/10/2024   GFRNONAA 104 12/27/2016   GFRAA 120 12/27/2016    Lab Results  Component Value Date   WBC 6.0 04/23/2021   NEUTROABS 3.8 04/23/2021   HGB 14.4 04/23/2021   HCT 43.0 04/23/2021   MCV 92 04/23/2021   PLT 358 04/23/2021     STUDIES: [place] RT BREAST BX W LOC DEV 1ST LESION IMG BX SPEC US GUIDE Addendum Date: 02/17/2024 ADDENDUM REPORT: 02/17/2024 10:39 ADDENDUM: PATHOLOGY revealed: 1. Breast, right, needle core biopsy, 11:00 5 cmfn (ribbon) : - INVASIVE MAMMARY CARCINOMA, NO SPECIAL TYPE. - OVERALL GRADE: 1. -  LYMPHOVASCULAR INVASION: NOT IDENTIFIED. - CANCER LENGTH: 9 MM - CALCIFICATIONS: NOT IDENTIFIED. - DUCTAL CARCINOMA IN SITU: PRESENT, INTERMEDIATE GRADE. Pathology results are CONCORDANT with imaging findings, per Dr. Meda Klinefelter. Pathology results and recommendations were discussed with patient via telephone on 02/17/2024 by Randa Lynn RN. Patient reported biopsy site doing well with no adverse symptoms, and only slight tenderness at the site. Post biopsy care instructions were reviewed, questions were answered and my direct phone number was provided. Patient was instructed to call [facility] for any additional questions or concerns related to biopsy site. RECOMMENDATIONS: 1. Surgical and oncological consultation. Request for surgical and oncological consultation relayed to Irving Shows RN at [facility] by Randa Lynn RN on 02/16/2024. 2. Recommend pretreatment bilateral breast MRI with and without contrast given patient's relatively young age  and to evaluate extent of breast disease. Pathology results reported by Randa Lynn RN on 02/17/2024. Electronically Signed   By: Meda Klinefelter M.D.   On: 02/17/2024 10:39   Result Date: 02/17/2024 CLINICAL DATA:  RIGHT breast architectural distortion EXAM: ULTRASOUND GUIDED RIGHT BREAST CORE NEEDLE BIOPSY COMPARISON:  Previous exam(s). PROCEDURE: I met with the patient and we discussed the procedure of ultrasound-guided biopsy, including benefits and alternatives. We discussed the high likelihood of a successful procedure. We discussed the risks of the procedure, including infection, bleeding, tissue injury, clip migration, and inadequate sampling. Informed written consent was given. The usual time-out protocol was performed immediately prior to the procedure. Lesion quadrant: Upper outer quadrant Using sterile technique and 1% lidocaine and 1% lidocaine with epinephrine as local anesthetic, under direct ultrasound visualization, a 14  gauge spring-loaded device was used to perform biopsy of a mass at 11 o'clock 5 cm from the nipple using a inferolateral approach. At the conclusion of the procedure a RIBBON shaped tissue marker clip was deployed into the biopsy cavity. Follow up 2 view mammogram was performed and dictated separately. IMPRESSION: Ultrasound guided biopsy of a RIGHT breast mass 11 o'clock. No apparent complications. Electronically Signed: By: Meda Klinefelter M.D. On: 02/15/2024 11:58   MM CLIP PLACEMENT RIGHT Result Date: 02/15/2024 CLINICAL DATA:  Status post ultrasound-guided biopsy EXAM: 3D DIAGNOSTIC RIGHT MAMMOGRAM POST ULTRASOUND BIOPSY COMPARISON:  Previous exam(s). ACR Breast Density Category c: The breasts are heterogeneously dense, which may obscure small masses. FINDINGS: 3D Mammographic images were obtained following ultrasound guided biopsy of a RIGHT breast mass. The RIBBON biopsy marking clip is in expected position at the site of biopsy. This is at site of mammographically noted architectural distortion IMPRESSION: Appropriate positioning of the RIBBON shaped biopsy marking clip at the site of biopsy in the upper outer breast. Final Assessment: Post Procedure Mammograms for Marker Placement Electronically Signed   By: Meda Klinefelter M.D.   On: 02/15/2024 11:57   MM 3D DIAGNOSTIC MAMMOGRAM UNILATERAL RIGHT BREAST Result Date: 02/15/2024 CLINICAL DATA:  RIGHT breast distortion callback EXAM: DIGITAL DIAGNOSTIC UNILATERAL RIGHT MAMMOGRAM WITH TOMOSYNTHESIS AND CAD; ULTRASOUND RIGHT BREAST LIMITED TECHNIQUE: Right digital diagnostic mammography and breast tomosynthesis was performed. The images were evaluated with computer-aided detection. ; Targeted ultrasound examination of the right breast was performed COMPARISON:  Previous exam(s). ACR Breast Density Category c: The breasts are heterogeneously dense, which may obscure small masses. FINDINGS: Spot compression tomosynthesis views demonstrates a persistent  area of architectural distortion in the upper breast at anterior to middle depth. It is best seen on spot CC volume 2 slice 17. Targeted ultrasound was performed of the RIGHT upper outer breast. At 11 o'clock 5 cm from the nipple there is an irregular hypoechoic mass with irregular margins. It measures 7 by 5 x 6 mm. This corresponds well to the site of architectural distortion. Targeted ultrasound was performed of the RIGHT axilla. No suspicious axillary lymph nodes are visualized. IMPRESSION: 1. There is a 7 mm mass at the site of architectural distortion mammographic concern which is concerning for malignancy. Recommend ultrasound-guided biopsy for definitive characterization. 2. No suspicious RIGHT axillary adenopathy. RECOMMENDATION: RIGHT breast ultrasound-guided biopsy x1 I have discussed the findings and recommendations with the patient. The biopsy procedure was discussed with the patient and questions were answered. Patient expressed their understanding of the biopsy recommendation. Patient will be scheduled for biopsy today. Ordering provider will be notified. If applicable, a reminder letter will be sent to the  patient regarding the next appointment. BI-RADS CATEGORY  4: Suspicious. Electronically Signed   By: Meda Klinefelter M.D.   On: 02/15/2024 11:01   [place] LIMITED ULTRASOUND INCLUDING AXILLA RIGHT BREAST Result Date: 02/15/2024 CLINICAL DATA:  RIGHT breast distortion callback EXAM: DIGITAL DIAGNOSTIC UNILATERAL RIGHT MAMMOGRAM WITH TOMOSYNTHESIS AND CAD; ULTRASOUND RIGHT BREAST LIMITED TECHNIQUE: Right digital diagnostic mammography and breast tomosynthesis was performed. The images were evaluated with computer-aided detection. ; Targeted ultrasound examination of the right breast was performed COMPARISON:  Previous exam(s). ACR Breast Density Category c: The breasts are heterogeneously dense, which may obscure small masses. FINDINGS: Spot compression tomosynthesis views demonstrates a persistent  area of architectural distortion in the upper breast at anterior to middle depth. It is best seen on spot CC volume 2 slice 17. Targeted ultrasound was performed of the RIGHT upper outer breast. At 11 o'clock 5 cm from the nipple there is an irregular hypoechoic mass with irregular margins. It measures 7 by 5 x 6 mm. This corresponds well to the site of architectural distortion. Targeted ultrasound was performed of the RIGHT axilla. No suspicious axillary lymph nodes are visualized. IMPRESSION: 1. There is a 7 mm mass at the site of architectural distortion mammographic concern which is concerning for malignancy. Recommend ultrasound-guided biopsy for definitive characterization. 2. No suspicious RIGHT axillary adenopathy. RECOMMENDATION: RIGHT breast ultrasound-guided biopsy x1 I have discussed the findings and recommendations with the patient. The biopsy procedure was discussed with the patient and questions were answered. Patient expressed their understanding of the biopsy recommendation. Patient will be scheduled for biopsy today. Ordering provider will be notified. If applicable, a reminder letter will be sent to the patient regarding the next appointment. BI-RADS CATEGORY  4: Suspicious. Electronically Signed   By: Meda Klinefelter M.D.   On: 02/15/2024 11:01   MM 3D SCREENING MAMMOGRAM BILATERAL BREAST Result Date: 02/13/2024 CLINICAL DATA:  Screening. EXAM: DIGITAL SCREENING BILATERAL MAMMOGRAM WITH TOMOSYNTHESIS AND CAD TECHNIQUE: Bilateral screening digital craniocaudal and mediolateral oblique mammograms were obtained. Bilateral screening digital breast tomosynthesis was performed. The images were evaluated with computer-aided detection. COMPARISON:  Previous exam(s). ACR Breast Density Category c: The breasts are heterogeneously dense, which may obscure small masses. FINDINGS: In the right breast, possible distortion warrants further evaluation. In the left breast, no findings suspicious for  malignancy. IMPRESSION: Further evaluation is suggested for possible distortion in the right breast. RECOMMENDATION: Diagnostic mammogram and possibly ultrasound of the right breast. (Code:[id number]) The patient will be contacted regarding the findings, and additional imaging will be scheduled. BI-RADS CATEGORY  0: Incomplete: Need additional imaging evaluation. Electronically Signed   By: Frederico Hamman M.D.   On: 02/13/2024 16:02    ASSESSMENT: Clinical stage Ia ER/PR positive, HER2 negative invasive carcinoma of the right breast.  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  Patient has seen surgery and is scheduled for lumpectomy on February 27, 2024.  She is unlikely to require adjuvant chemotherapy, but will send Oncotype DX score for completeness.  She will require adjuvant XRT and a referral has been made to radiation oncology.  Finally, given the ER/PR status of her malignancy she will benefit from tamoxifen for a total of 5 years.  No further intervention is needed.  Return to clinic approximately 2 weeks postoperatively to discuss her final pathology results and additional treatment planning. Genetic testing: Patient reports negative testing in the past.  I spent a  total of 60 minutes reviewing chart data, face-to-face evaluation with the patient, counseling and coordination of care as detailed above.   Patient expressed understanding and was in agreement with this plan. She also understands that She can call clinic at any time with any questions, concerns, or complaints.    Cancer Staging  Invasive ductal carcinoma of right breast [facility]) Staging form: Breast, AJCC 8th Edition - Clinical stage from 02/22/2024: Stage IA (cT1b, cN0, cM0, G1, ER+, PR+, HER2-) - Signed by Jeralyn Ruths, MD on 02/22/2024 Stage prefix: Initial diagnosis Histologic grading system: 3 grade system   Jeralyn Ruths, MD   02/22/2024 12:55 PM

## 2024-02-21 NOTE — H&P (View-Only) (Signed)
 PATIENT PROFILE: Amanda Coffey is a 48 y.o. female who presents to the Clinic for consultation at the request of [place], [place] for evaluation of right breast cancer.  PCP:  Rica Records, [place]  History of Present Illness Amanda Coffey is a 48 year old female who presents for evaluation of right breast cancer.  She underwent a routine bilateral screening mammogram on February 10, 2024, which suggested a possible abnormality in the right breast. A follow-up diagnostic mammogram and breast ultrasound on February 15, 2024, confirmed a 7 mm mass located at the 11 o'clock position, 5 cm from the nipple of the right breast. 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 No prior history of breast abnormalities or biopsies was noted, and she did not experience any masses, skin changes, or discharge prior to the screening mammogram. She has been BRCA tested twice with negative results.  No significant issues were reported following the biopsy, which was performed last Wednesday. She denies any bruising or major complications.  Her family history is significant for breast cancer, with multiple family members affected, including two aunts. Her mother had uterine cancer. No other family members have been tested for BRCA mutations.  She recalls her first menstrual cycle occurring in seventh grade, approximately at age 55, and her last menstrual period was last week. She has used birth control for several years and has had three pregnancies, resulting in two births.  Used estrogen and progesterone therapy: None History of Radiation to the chest: None Previous breast biopsies: None  PROBLEM LIST: Breast cancer  GENERAL REVIEW OF SYSTEMS:   General ROS: negative for - chills, fatigue, fever, weight gain or weight loss Allergy and Immunology ROS: negative for - hives  Hematological and Lymphatic ROS: negative for - bleeding problems  or bruising, negative for palpable nodes Endocrine ROS: negative for - heat or cold intolerance, hair changes Respiratory ROS: negative for - cough, shortness of breath or wheezing Cardiovascular ROS: no chest pain or palpitations GI ROS: negative for nausea, vomiting, abdominal pain, diarrhea, constipation Musculoskeletal ROS: negative for - joint swelling or muscle pain Neurological ROS: negative for - confusion, syncope Dermatological ROS: negative for pruritus and rash Psychiatric: negative for anxiety, depression, difficulty sleeping and memory loss  MEDICATIONS: No current outpatient medications on file.   No current facility-administered medications for this visit.   Levocetirizine Dihydrochloride (XYZAL PO)  levonorgestrel-ethinyl estradiol (AVIANE) 0.1-20 MG-MCG tablet Multiple Vitamins-Minerals (MULTIVITAMIN WITH MINERALS) tablet  ALLERGIES: Patient has no known allergies.  PAST MEDICAL HISTORY: Past Medical History: Diagnosis Date  Abnormal Pap smear of cervix 1999   LGSIL  BRCA negative 2013; 2/19   BRCA neg; MyRisk neg  Family history of breast cancer     has tested negative for HBOC in 2013/ negative Myrisk test 2019/ lifetime risk 24.6%  GERD (gastroesophageal reflux disease)    Goiter    History of chicken pox    Increased risk of breast cancer     IBIS=25%, riskscore=24.6%  Interstitial cystitis    Screening for colon cancer 10/2020   Cologuard NEG; repeat after 3 yrs  Shingles     PAST SURGICAL HISTORY: History reviewed. No pertinent surgical history.   FAMILY HISTORY: No family history on file.   SOCIAL HISTORY: Social History   Socioeconomic History   Marital status: Married  Tobacco Use   Smoking status: Never   Smokeless tobacco: Never  Vaping Use  Vaping status: Never Used  Substance and Sexual Activity   Alcohol use: Never   Drug use: Never   Social Drivers of Corporate investment banker Strain: Low  Risk  (02/21/2024)   Overall Financial Resource Strain (CARDIA)    Difficulty of Paying Living Expenses: Not hard at all  Food Insecurity: No Food Insecurity (02/21/2024)   Hunger Vital Sign    Worried About Running Out of Food in the Last Year: Never true    Ran Out of Food in the Last Year: Never true  Transportation Needs: No Transportation Needs (02/21/2024)   PRAPARE - Administrator, Civil Service (Medical): No    Lack of Transportation (Non-Medical): No    PHYSICAL EXAM: Vitals:   02/21/24 1334  BP: (!) 150/95  Pulse: 80   Body mass index is 29.76 kg/m. Weight: 86.2 kg (190 lb)   GENERAL: Alert, active, oriented x3  HEENT: Pupils equal reactive to light. Extraocular movements are intact. Sclera clear. Palpebral conjunctiva normal red color.Pharynx clear.  NECK: Supple with no palpable mass and no adenopathy.  LUNGS: Sound clear with no rales rhonchi or wheezes.  HEART: Regular rhythm S1 and S2 without murmur.  BREAST: breasts appear normal, no suspicious masses, no skin or nipple changes or axillary nodes.  ABDOMEN: Soft and depressible, nontender with no palpable mass, no hepatomegaly.  EXTREMITIES: Well-developed well-nourished symmetrical with no dependent edema.  NEUROLOGICAL: Awake alert oriented, facial expression symmetrical, moving all extremities.  REVIEW OF DATA: I have reviewed the following data today: No results found for any previous visit.     Results RADIOLOGY (I personally evaluated this images) Screening mammogram: Suggested possible assertion of the right breast (02/10/2024) Diagnostic mammogram: Confirmed 7 mm mass at 11 o'clock, 5 cm from the nipple of the right breast (02/15/2024) Breast ultrasound: Confirmed 7 mm mass at 11 o'clock, 5 cm from the nipple of the right breast (02/15/2024)  PATHOLOGY Biopsy: Invasive mammary carcinoma, ER positive, PR positive, HER2 negative (02/15/2024)  Assessment & Plan Right breast cancer   A  7 mm invasive mammary carcinoma is present in the right breast at the 11 o'clock position, 5 cm from the nipple. The tumor is ER positive, PR positive, and HER2 negative. There is a significant family history of breast cancer, but she is negative for the BRCA gene. She prefers lumpectomy over mastectomy due to the tumor's size and characteristics, which offer the same cure rate. Surgical options, including lumpectomy with sentinel lymph node biopsy, have been explained. Radiation and endocrine therapy may be needed, but chemotherapy is not necessary due to the tumor's size and pathology. The lumpectomy will involve removing the tumor with negative margins and performing a sentinel lymph node biopsy to check for metastasis. This is a same-day surgery with recovery expected in a couple of weeks. Post-surgery swelling and discomfort will be managed with a sports bra, ice packs, and pain medication. Schedule lumpectomy with sentinel lymph node biopsy for Monday. Coordinate with Dr. Orlie Dakin for further discussion of adjuvant radiation and endocrine therapy. Advise use of a sports bra post-surgery for comfort and instruct on ice pack use for swelling. Plan a follow-up appointment in two weeks post-surgery.  Patient was oriented again about the pathology results. Surgical alternatives were discussed with patient including partial vs total mastectomy. Surgical technique and post operative care was discussed with patient. Risk of surgery was discussed with patient including but not limited to: wound infection, seroma, hematoma, brachial plexopathy, mondor's disease (  thrombosis of small veins of breast), chronic wound pain, breast lymphedema, altered sensation to the nipple and cosmesis among others.   Malignant neoplasm of upper-outer quadrant of right breast in female, estrogen receptor positive (CMS/HHS-HCC) [C50.411, Z17.0]  Patient and her husband verbalized understanding, all questions were answered, and were  agreeable with the plan outlined above.   Carolan Shiver, MD  Electronically signed by Carolan Shiver, MD

## 2024-02-21 NOTE — Progress Notes (Unsigned)
Patient has no concerns at this time

## 2024-02-21 NOTE — Progress Notes (Signed)
Received referral for newly diagnosed breast cancer from [facility].  Navigation initiated.  Spoke with daughter Maureen to set up appointments.   She will see both med onc and surgeon tomorrow, Dr. Agrawal at 2:00 and Dr. Sakai at 10:15. 

## 2024-02-22 ENCOUNTER — Encounter: Payer: Self-pay | Admitting: *Deleted

## 2024-02-22 ENCOUNTER — Telehealth: Payer: Self-pay

## 2024-02-22 ENCOUNTER — Ambulatory Visit: Attending: Oncology | Admitting: Occupational Therapy

## 2024-02-22 ENCOUNTER — Encounter: Payer: Self-pay | Admitting: Occupational Therapy

## 2024-02-22 DIAGNOSIS — R293 Abnormal posture: Secondary | ICD-10-CM | POA: Diagnosis present

## 2024-02-22 DIAGNOSIS — C50911 Malignant neoplasm of unspecified site of right female breast: Secondary | ICD-10-CM | POA: Insufficient documentation

## 2024-02-22 NOTE — Therapy (Signed)
 OUTPATIENT OCCUPATIONAL THERAPY BREAST CANCER BASELINE EVALUATION   Patient Name: Amanda Coffey MRN: [id number] DOB:1976-03-04, 48 y.o., female Today's Date: 02/22/2024  END OF SESSION:  OT End of Session - 02/22/24 1736     Visit Number 1    Number of Visits 6    Date for OT Re-Evaluation 05/16/24    OT Start Time 1031    OT Stop Time 1100    OT Time Calculation (min) 29 min    Activity Tolerance Patient tolerated treatment well    Behavior During Therapy [facility] for tasks assessed/performed             Past Medical History:  Diagnosis Date   Abnormal Pap smear of cervix 1999   LGSIL   BRCA negative 2013; 2/19   BRCA neg; MyRisk neg   Family history of breast cancer    has tested negative for HBOC in 2013/ negative Myrisk test 2019/ lifetime risk 24.6%   GERD (gastroesophageal reflux disease)    Goiter    History of chicken pox    Increased risk of breast cancer    IBIS=25%, riskscore=24.6%   Interstitial cystitis    Screening for colon cancer 10/2020   Cologuard NEG; repeat after 3 yrs   Shingles    Past Surgical History:  Procedure Laterality Date   BREAST BIOPSY Right 02/15/2024   [place] Bx, ribbon clip, path pending   BREAST BIOPSY Right 02/15/2024   [place] RT BREAST BX W LOC DEV 1ST LESION IMG BX SPEC US GUIDE 02/15/2024 [facility]   COLPOSCOPY  1999   LGSIL Pap, mild dysplasia, VAIN 1   DILATION AND CURETTAGE OF UTERUS  2005   due to missed abortion   HYSTEROSCOPY WITH D & C N/A 06/27/2015   Procedure: DILATATION AND CURETTAGE /HYSTEROSCOPY;  Surgeon: Elenora Fender Ward, MD;  Location: [facility] ORS;  Service: Gynecology;  Laterality: N/A;   Patient Active Problem List   Diagnosis Date Noted   Invasive ductal carcinoma of right breast (HCC) 02/22/2024   Acute non-recurrent pansinusitis 12/17/2020   History of shingles 12/17/2020   Skin sensitivity 12/17/2020   History of COVID-19 12/17/2020   Heat intolerance 11/03/2020   Increased risk of breast cancer     Diffuse nontoxic goiter 06/07/2018   Family history of breast cancer 01/12/2018   Abnormal blood chemistry 04/20/2016   Abnormal liver enzymes 04/20/2016   Interstitial cystitis 04/20/2016   Ganglion cyst of wrist 04/20/2016   Occipital mass 04/20/2016   Herpes zoster without complication 07/08/2009    PCP: Copland  REFERRING PROVIDER: Dr Orlie Dakin  REFERRING DIAG: R breast Cancer  THERAPY DIAG:  Abnormal posture  Rationale for Evaluation and Treatment: Rehabilitation  ONSET DATE: 02/15/24  SUBJECTIVE:  SUBJECTIVE STATEMENT: Patient reports she is here today after being refer by one of her medical team for her newly diagnosed right breast cancer.   PERTINENT HISTORY:  Patient was diagnosed with right  breast cancer - plan is to have R lumpectomy by Dr Maia Plan on 02/27/24 with radiation and ? Chemo.    PATIENT GOALS:   reduce lymphedema risk and learn post op HEP.   PAIN:  Are you having pain? No  PRECAUTIONS: Active CA     HAND DOMINANCE: right  WEIGHT BEARING RESTRICTIONS: No  FALLS:  Has patient fallen in last 6 months? 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 and tasks around the house     OBJECTIVE:  COGNITION: Overall cognitive status: Within functional limits for tasks assessed    POSTURE:  Forward head and rounded shoulders posture  UPPER EXTREMITY AROM/PROM:  A/PROM RIGHT   eval   Shoulder extension WNL  Shoulder flexion WNL  Shoulder abduction WNL  Shoulder internal rotation WNL  Shoulder external rotation WNL    (Blank rows = not tested)  A/PROM LEFT   eval  Shoulder extension WNL  Shoulder flexion WNL  Shoulder abduction WNL  Shoulder internal rotation WNL   Shoulder  external rotation WNL    (Blank rows = not tested)  CERVICAL AROM: All within normal limits:   UPPER EXTREMITY STRENGTH: Shoulder flexion and abduction 5/5 strength.  External and internal rotation 4+/5  LYMPHEDEMA ASSESSMENTS:   LANDMARK RIGHT   eval  10 cm proximal to olecranon process 31  Olecranon process 26.4  10 cm proximal to ulnar styloid process 21.2  Just proximal to ulnar styloid process   Across hand at thumb web space   At base of 2nd digit   (Blank rows = not tested)  LANDMARK LEFT   eval  10 cm proximal to olecranon process 30.5  Olecranon process 26.5  10 cm proximal to ulnar styloid process 21.5  Just proximal to ulnar styloid process   Across hand at thumb web space   At base of 2nd digit   (Blank rows = not tested)  L-DEX LYMPHEDEMA SCREENING:  The patient was assessed using the L-Dex machine today to produce a lymphedema index baseline score. The patient will be reassessed on a regular basis (typically every 3 months) to obtain new L-Dex scores. If the score is > 6.5 points away from his/her baseline score indicating onset of subclinical lymphedema, it will be recommended to wear a compression garment for 4 weeks, 12 hours per day and then be reassessed. If the score continues to be > 6.5 points from baseline at reassessment, we will initiate lymphedema treatment. Assessing in this manner has a 95% rate of preventing clinically significant lymphedema.   L-DEX FLOWSHEETS - 02/22/24 1700       L-DEX LYMPHEDEMA SCREENING   Measurement Type Unilateral    L-DEX MEASUREMENT EXTREMITY Upper Extremity    POSITION  Standing    DOMINANT SIDE Right    At Risk Side Right    BASELINE SCORE (UNILATERAL) 1.3               PATIENT EDUCATION:  Education details: Lymphedema risk reduction will be done after surgery and post op shoulder/posture HEP done Person educated: Patient Education method: Explanation, Demonstration, Handout Education comprehension:  Patient verbalized understanding and returned demonstration  HOME EXERCISE PROGRAM: Patient was instructed today in a home exercise program  today for post op shoulder range of motion. These included active assist shoulder flexion and abduction in supine -as well as external rotation behind head.  2-3 times a day 10 reps keeping pain under 1-2/10 pain.   Patient to follow-up surgeon instructions.  Keeping range of motion below 90 degrees until okayed by surgeon.   Scapular retraction, can be done several times during the day.     ASSESSMENT:  CLINICAL IMPRESSION: Her multidisciplinary medical team has met to assess and determine a recommended treatment plan. She is planning to have right lumpectomy on 02/27/2024 by Dr. Maia Plan. She will benefit from a post op OT reassessment to determine needs and from L-Dex screens every 3 months for 2 years to detect subclinical lymphedema.  Pt will benefit from skilled therapeutic intervention to improve on the following deficits: Decreased knowledge of precautions and lymphedema education, impaired UE functional use, pain, decreased ROM, postural dysfunction.   OT treatment/interventions: ADL/self-care home management, pt/family education, therapeutic exercise,manual therapy  REHAB POTENTIAL: Good  CLINICAL DECISION MAKING: Stable/uncomplicated  EVALUATION COMPLEXITY: Low   GOALS: Goals reviewed with patient? YES  LONG TERM GOALS: (STG=LTG)    Name Target Date Goal status  1 Pt will be able to verbalize understanding of pertinent lymphedema risk reduction practices relevant to her dx specifically related to skin care.  Baseline:  No knowledge 6 to 12 weeks Initial  2 Pt will be able to return demo and/or verbalize understanding of the post op HEP related to regaining shoulder ROM. Baseline:  No knowledge Today Achieved at eval       4 Pt will demo she has regained full shoulder ROM and function post operatively compared to baselines.  Baseline:  See objective measurements taken today. 6 to 12 weeks Initial    PLAN:  OT FREQUENCY/DURATION: EVAL and 5 follow up appointment 12 weeks  PLAN FOR NEXT SESSION: will reassess 2-3 weeks post op to determine needs.   Patient will follow up at outpatient cancer rehab 2-3 weeks following surgery.  If the patient requires occupational therapy at that time, a specific plan will be dictated and sent to the referring physician for approval. T Occupational Therapy Information for After Breast Cancer Surgery/Treatment:  Lymphedema is a swelling condition that you may be at risk for in your arm if you have lymph nodes removed from the armpit area.  After a sentinel node biopsy, the risk is approximately 5-9% and is higher after an axillary node dissection.  There is treatment available for this condition and it is not life-threatening.  Contact your physician or occupational therapist with concerns. You may begin the 4 shoulder/posture exercises (see additional sheet) when permitted by your physician (typically a week after surgery).  If you have drains, you may need to wait until those are removed before beginning range of motion exercises.  A general recommendation is to not lift your arms above shoulder height until drains are removed.  These exercises should be done to your tolerance and gently.  This is not a "no pain/no gain" type of recovery so listen to your body and stretch into the range of motion that you can tolerate, stopping if you have pain.  If you are having immediate reconstruction, ask your plastic surgeon about doing exercises as he or she may want you to wait. .  While undergoing any medical procedure or treatment, try to avoid blood pressure being taken or needle sticks from occurring on the arm on the side of cancer.  This recommendation begins after surgery and continues for the rest of your life.  This may help reduce your risk of getting lymphedema (swelling in your arm). An  excellent resource for those seeking information on lymphedema is the National Lymphedema Network's web site. It can be accessed at www.lymphnet.org If you notice swelling in your hand, arm or breast at any time following surgery (even if it is many years from now), please contact your doctor or occupational therapist to discuss this.  Lymphedema can be treated at any time but it is easier for you if it is treated early on.  If you feel like your shoulder motion is not returning to normal in a reasonable amount of time, please contact your surgeon or occupational therapist.  [facility] [phone]. [address] [place], [place] [address]       Oletta Cohn, OTR/L,CLT 02/22/2024, 5:38 PM

## 2024-02-22 NOTE — Telephone Encounter (Signed)
 Dr. Servando Snare patient has recently been diagnosed with Breast Cancer.  Her lumpectomy is scheduled for 02/27/24.  Her colonoscopy was scheduled for the following day.   She has requested to reschedule her colonoscopy. How soon afterwards can she have her colonoscopy?  Please advise.  Thanks, [place], [place]

## 2024-02-22 NOTE — Telephone Encounter (Signed)
 Pt lmovm at Endo requesting to reschedule her procedure. Please call pt

## 2024-02-22 NOTE — Telephone Encounter (Signed)
 Thanks [employer].  Call has been returned to patient.  Marcelino Duster, CMA

## 2024-02-22 NOTE — Addendum Note (Signed)
 Addended by: Oletta Cohn on: 02/22/2024 05:54 PM   Modules accepted: Orders

## 2024-02-23 ENCOUNTER — Encounter
Admission: RE | Admit: 2024-02-23 | Discharge: 2024-02-23 | Disposition: A | Discharge status: Home / Self Care | Source: Ambulatory Visit | Attending: General Surgery | Admitting: General Surgery

## 2024-02-23 ENCOUNTER — Other Ambulatory Visit: Payer: Self-pay

## 2024-02-23 ENCOUNTER — Ambulatory Visit
Admission: RE | Admit: 2024-02-23 | Discharge: 2024-02-23 | Disposition: A | Discharge status: Home / Self Care | Source: Ambulatory Visit | Attending: General Surgery | Admitting: General Surgery

## 2024-02-23 ENCOUNTER — Encounter
Admission: RE | Admit: 2024-02-23 | Discharge: 2024-02-23 | Discharge status: Home / Self Care | Source: Ambulatory Visit | Attending: General Surgery

## 2024-02-23 DIAGNOSIS — Z170 Estrogen receptor positive status [ER+]: Secondary | ICD-10-CM | POA: Insufficient documentation

## 2024-02-23 DIAGNOSIS — Z01812 Encounter for preprocedural laboratory examination: Secondary | ICD-10-CM | POA: Insufficient documentation

## 2024-02-23 DIAGNOSIS — C50411 Malignant neoplasm of upper-outer quadrant of right female breast: Secondary | ICD-10-CM | POA: Insufficient documentation

## 2024-02-23 HISTORY — PX: OTHER SURGICAL HISTORY: SHX169

## 2024-02-23 HISTORY — DX: Other complications of anesthesia, initial encounter: T88.59XA

## 2024-02-23 HISTORY — DX: Malignant (primary) neoplasm, unspecified: C80.1

## 2024-02-23 HISTORY — DX: Headache, unspecified: R51.9

## 2024-02-23 LAB — CBC
HCT: 40.4 % (ref 36.0–46.0)
Hemoglobin: 14 g/dL (ref 12.0–15.0)
MCH: 32 pg (ref 26.0–34.0)
MCHC: 34.7 g/dL (ref 30.0–36.0)
MCV: 92.2 fL (ref 80.0–100.0)
Platelets: 397 10*3/uL (ref 150–400)
RBC: 4.38 MIL/uL (ref 3.87–5.11)
RDW: 11.5 % (ref 11.5–15.5)
WBC: 7.4 10*3/uL (ref 4.0–10.5)
nRBC: 0 % (ref 0.0–0.2)

## 2024-02-23 MED ORDER — LIDOCAINE HCL 1 % IJ SOLN
5.0000 mL | Freq: Once | INTRAMUSCULAR | Status: AC
  Administered 2024-02-23: 5 mL
  Filled 2024-02-23: qty 5

## 2024-02-23 NOTE — Patient Instructions (Addendum)
 Your procedure is scheduled on: 02/27/24 - Monday Report to the Registration Desk on the 1st floor of the [facility]. To find out your arrival time, please call [phone] between 1PM - 3PM on: 02/24/24 - Friday If your arrival time is 6:00 am, do not arrive before that time as the [facility] entrance doors do not open until 6:00 am.  REMEMBER: Instructions that are not followed completely may result in serious medical risk, up to and including death; or upon the discretion of your surgeon and anesthesiologist your surgery may need to be rescheduled.  Do not eat food or drink any liquids after midnight the night before surgery.  No gum chewing or hard candies.  One week prior to surgery: Stop Anti-inflammatories (NSAIDS) such as Advil, Aleve, Ibuprofen, Motrin, Naproxen, Naprosyn and Aspirin based products such as Excedrin, Goody's Powder, BC Powder. You may take Tylenol if needed for pain up until the day of surgery.  Stop ANY OVER THE COUNTER supplements until after surgery.   ON THE DAY OF SURGERY ONLY TAKE THESE MEDICATIONS WITH SIPS OF WATER:  none   No Alcohol for 24 hours before or after surgery.  No Smoking including e-cigarettes for 24 hours before surgery.  No chewable tobacco products for at least 6 hours before surgery.  No nicotine patches on the day of surgery.  Do not use any "recreational" drugs for at least a week (preferably 2 weeks) before your surgery.  Please be advised that the combination of cocaine and anesthesia may have negative outcomes, up to and including death. If you test positive for cocaine, your surgery will be cancelled.  On the morning of surgery brush your teeth with toothpaste and water, you may rinse your mouth with mouthwash if you wish. Do not swallow any toothpaste or mouthwash.  Use CHG Soap or wipes as directed on instruction sheet.  Do not wear jewelry, make-up, hairpins, clips or nail polish.  For welded (permanent) jewelry:  bracelets, anklets, waist bands, etc.  Please have this removed prior to surgery.  If it is not removed, there is a chance that hospital personnel will need to cut it off on the day of surgery.  Do not wear lotions, powders, or perfumes.   Do not shave body hair from the neck down 48 hours before surgery.  Contact lenses, hearing aids and dentures may not be worn into surgery.  Do not bring valuables to the hospital. [facility] is not responsible for any missing/lost belongings or valuables.   Notify your doctor if there is any change in your medical condition (cold, fever, infection).  Wear comfortable clothing (specific to your surgery type) to the hospital.  After surgery, you can help prevent lung complications by doing breathing exercises.  Take deep breaths and cough every 1-2 hours. Your doctor may order a device called an Incentive Spirometer to help you take deep breaths.  When coughing or sneezing, hold a pillow firmly against your incision with both hands. This is called "splinting." Doing this helps protect your incision. It also decreases belly discomfort.  If you are being admitted to the hospital overnight, leave your suitcase in the car. After surgery it may be brought to your room.  In case of increased patient census, it may be necessary for you, the patient, to continue your postoperative care in the Same Day Surgery department.  If you are being discharged the day of surgery, you will not be allowed to drive home. You will need  a responsible individual to drive you home and stay with you for 24 hours after surgery.   If you are taking public transportation, you will need to have a responsible individual with you.  Please call the Pre-admissions Testing Dept. at [phone] if you have any questions about these instructions.  Surgery Visitation Policy:  Patients having surgery or a procedure may have two visitors.  Children under the age of 21 must have an  adult with them who is not the patient.  Inpatient Visitation:    Visiting hours are 7 a.m. to 8 p.m. Up to four visitors are allowed at one time in a patient room. The visitors may rotate out with other people during the day.  One visitor age 2 or older may stay with the patient overnight and must be in the room by 8 p.m.

## 2024-02-24 ENCOUNTER — Other Ambulatory Visit: Payer: Self-pay | Admitting: General Surgery

## 2024-02-24 DIAGNOSIS — Z170 Estrogen receptor positive status [ER+]: Secondary | ICD-10-CM

## 2024-02-27 ENCOUNTER — Ambulatory Visit
Admission: RE | Admit: 2024-02-27 | Discharge: 2024-02-27 | Disposition: A | Discharge status: Home / Self Care | Source: Ambulatory Visit | Attending: General Surgery | Admitting: General Surgery

## 2024-02-27 ENCOUNTER — Ambulatory Visit
Admission: RE | Admit: 2024-02-27 | Discharge: 2024-02-27 | Disposition: A | Discharge status: Home / Self Care | Source: Ambulatory Visit | Attending: General Surgery

## 2024-02-27 ENCOUNTER — Ambulatory Visit

## 2024-02-27 ENCOUNTER — Ambulatory Visit: Payer: Self-pay | Admitting: Urgent Care

## 2024-02-27 ENCOUNTER — Encounter: Payer: Self-pay | Admitting: Gastroenterology

## 2024-02-27 ENCOUNTER — Encounter
Admission: RE | Disposition: A | Discharge status: Home / Self Care | Payer: Self-pay | Source: Home / Self Care | Attending: General Surgery

## 2024-02-27 ENCOUNTER — Other Ambulatory Visit: Payer: Self-pay

## 2024-02-27 ENCOUNTER — Ambulatory Visit
Admission: RE | Admit: 2024-02-27 | Discharge: 2024-02-27 | Disposition: A | Discharge status: Home / Self Care | Attending: General Surgery | Admitting: General Surgery

## 2024-02-27 ENCOUNTER — Encounter: Payer: Self-pay | Admitting: General Surgery

## 2024-02-27 DIAGNOSIS — Z793 Long term (current) use of hormonal contraceptives: Secondary | ICD-10-CM | POA: Insufficient documentation

## 2024-02-27 DIAGNOSIS — Z1721 Progesterone receptor positive status: Secondary | ICD-10-CM | POA: Diagnosis not present

## 2024-02-27 DIAGNOSIS — Z8049 Family history of malignant neoplasm of other genital organs: Secondary | ICD-10-CM | POA: Insufficient documentation

## 2024-02-27 DIAGNOSIS — Z170 Estrogen receptor positive status [ER+]: Secondary | ICD-10-CM | POA: Insufficient documentation

## 2024-02-27 DIAGNOSIS — Z803 Family history of malignant neoplasm of breast: Secondary | ICD-10-CM | POA: Insufficient documentation

## 2024-02-27 DIAGNOSIS — Z1732 Human epidermal growth factor receptor 2 negative status: Secondary | ICD-10-CM | POA: Diagnosis not present

## 2024-02-27 DIAGNOSIS — Z01812 Encounter for preprocedural laboratory examination: Secondary | ICD-10-CM

## 2024-02-27 DIAGNOSIS — C50411 Malignant neoplasm of upper-outer quadrant of right female breast: Secondary | ICD-10-CM

## 2024-02-27 DIAGNOSIS — K219 Gastro-esophageal reflux disease without esophagitis: Secondary | ICD-10-CM | POA: Diagnosis not present

## 2024-02-27 DIAGNOSIS — N6021 Fibroadenosis of right breast: Secondary | ICD-10-CM | POA: Diagnosis not present

## 2024-02-27 HISTORY — PX: BREAST LUMPECTOMY: SHX2

## 2024-02-27 HISTORY — PX: SENTINEL NODE BIOPSY: SHX6608

## 2024-02-27 LAB — POCT PREGNANCY, URINE: Preg Test, Ur: NEGATIVE

## 2024-02-27 SURGERY — BREAST LUMPECTOMY
Anesthesia: General | Site: Breast | Laterality: Right

## 2024-02-27 MED ORDER — BUPIVACAINE-EPINEPHRINE (PF) 0.5% -1:200000 IJ SOLN
INTRAMUSCULAR | Status: AC
  Filled 2024-02-27: qty 30

## 2024-02-27 MED ORDER — DIPHENHYDRAMINE HCL 50 MG/ML IJ SOLN
INTRAMUSCULAR | Status: AC
Start: 2024-02-27 — End: ?
  Filled 2024-02-27: qty 1

## 2024-02-27 MED ORDER — LIDOCAINE HCL (PF) 2 % IJ SOLN
INTRAMUSCULAR | Status: AC
  Filled 2024-02-27: qty 5

## 2024-02-27 MED ORDER — DROPERIDOL 2.5 MG/ML IJ SOLN: 0.6250 mg | Freq: Once | INTRAMUSCULAR | Status: DC | PRN

## 2024-02-27 MED ORDER — CEFAZOLIN SODIUM-DEXTROSE 2-4 GM/100ML-% IV SOLN
2.0000 g | INTRAVENOUS | Status: AC
  Administered 2024-02-27: 2 g via INTRAVENOUS

## 2024-02-27 MED ORDER — HYDROCODONE-ACETAMINOPHEN 5-325 MG PO TABS: 1.0000 | ORAL_TABLET | Freq: Four times a day (QID) | ORAL | 0 refills | Status: AC | PRN

## 2024-02-27 MED ORDER — LIDOCAINE HCL (CARDIAC) PF 100 MG/5ML IV SOSY
PREFILLED_SYRINGE | INTRAVENOUS | Status: DC | PRN
  Administered 2024-02-27: 60 mg via INTRAVENOUS

## 2024-02-27 MED ORDER — CEFAZOLIN SODIUM-DEXTROSE 2-4 GM/100ML-% IV SOLN
INTRAVENOUS | Status: AC
  Filled 2024-02-27: qty 100

## 2024-02-27 MED ORDER — ORAL CARE MOUTH RINSE: 15.0000 mL | Freq: Once | OROMUCOSAL | Status: AC

## 2024-02-27 MED ORDER — METHYLENE BLUE (ANTIDOTE) 1 % IV SOLN
INTRAVENOUS | Status: AC
  Filled 2024-02-27: qty 10

## 2024-02-27 MED ORDER — TECHNETIUM TC 99M TILMANOCEPT KIT
0.9990 | PACK | Freq: Once | INTRAVENOUS | Status: AC | PRN
  Administered 2024-02-27: 0.999 via INTRADERMAL

## 2024-02-27 MED ORDER — FENTANYL CITRATE (PF) 100 MCG/2ML IJ SOLN
INTRAMUSCULAR | Status: DC | PRN
  Administered 2024-02-27 (×2): 50 ug via INTRAVENOUS

## 2024-02-27 MED ORDER — MIDAZOLAM HCL 2 MG/2ML IJ SOLN
INTRAMUSCULAR | Status: DC | PRN
  Administered 2024-02-27: 2 mg via INTRAVENOUS

## 2024-02-27 MED ORDER — PROPOFOL 10 MG/ML IV BOLUS
INTRAVENOUS | Status: DC | PRN
  Administered 2024-02-27: 200 mg via INTRAVENOUS

## 2024-02-27 MED ORDER — CHLORHEXIDINE GLUCONATE 0.12 % MT SOLN
OROMUCOSAL | Status: AC
  Filled 2024-02-27: qty 15

## 2024-02-27 MED ORDER — STERILE WATER FOR IRRIGATION IR SOLN
Status: DC | PRN
  Administered 2024-02-27: 500 mL

## 2024-02-27 MED ORDER — DEXAMETHASONE SODIUM PHOSPHATE 10 MG/ML IJ SOLN
INTRAMUSCULAR | Status: DC | PRN
  Administered 2024-02-27: 10 mg via INTRAVENOUS

## 2024-02-27 MED ORDER — FENTANYL CITRATE (PF) 100 MCG/2ML IJ SOLN
INTRAMUSCULAR | Status: AC
  Filled 2024-02-27: qty 2

## 2024-02-27 MED ORDER — ACETAMINOPHEN 10 MG/ML IV SOLN
INTRAVENOUS | Status: AC
  Filled 2024-02-27: qty 100

## 2024-02-27 MED ORDER — LACTATED RINGERS IV SOLN: INTRAVENOUS | Status: DC

## 2024-02-27 MED ORDER — BUPIVACAINE-EPINEPHRINE (PF) 0.5% -1:200000 IJ SOLN
INTRAMUSCULAR | Status: DC | PRN
  Administered 2024-02-27: 20 mL

## 2024-02-27 MED ORDER — ONDANSETRON HCL 4 MG/2ML IJ SOLN
INTRAMUSCULAR | Status: DC | PRN
  Administered 2024-02-27: 4 mg via INTRAVENOUS

## 2024-02-27 MED ORDER — MIDAZOLAM HCL 2 MG/2ML IJ SOLN
INTRAMUSCULAR | Status: AC
Start: 2024-02-27 — End: ?
  Filled 2024-02-27: qty 2

## 2024-02-27 MED ORDER — KETOROLAC TROMETHAMINE 30 MG/ML IJ SOLN
INTRAMUSCULAR | Status: DC | PRN
  Administered 2024-02-27: 30 mg via INTRAVENOUS

## 2024-02-27 MED ORDER — DEXAMETHASONE SODIUM PHOSPHATE 10 MG/ML IJ SOLN
INTRAMUSCULAR | Status: AC
  Filled 2024-02-27: qty 1

## 2024-02-27 MED ORDER — DEXMEDETOMIDINE HCL IN NACL 80 MCG/20ML IV SOLN
INTRAVENOUS | Status: DC | PRN
  Administered 2024-02-27: 8 ug via INTRAVENOUS

## 2024-02-27 MED ORDER — FENTANYL CITRATE (PF) 100 MCG/2ML IJ SOLN
25.0000 ug | INTRAMUSCULAR | Status: DC | PRN
  Administered 2024-02-27 (×2): 50 ug via INTRAVENOUS

## 2024-02-27 MED ORDER — PROPOFOL 10 MG/ML IV BOLUS
INTRAVENOUS | Status: AC
  Filled 2024-02-27: qty 20

## 2024-02-27 MED ORDER — ACETAMINOPHEN 10 MG/ML IV SOLN
INTRAVENOUS | Status: DC | PRN
  Administered 2024-02-27: 1000 mg via INTRAVENOUS

## 2024-02-27 MED ORDER — CHLORHEXIDINE GLUCONATE 0.12 % MT SOLN
15.0000 mL | Freq: Once | OROMUCOSAL | Status: AC
  Administered 2024-02-27: 15 mL via OROMUCOSAL

## 2024-02-27 SURGICAL SUPPLY — 33 items
CHLORAPREP W/TINT 26 (MISCELLANEOUS) IMPLANT
COVER PROBE GAMMA FINDER SLV (MISCELLANEOUS) ×1 IMPLANT
DERMABOND ADVANCED .7 DNX12 (GAUZE/BANDAGES/DRESSINGS) ×1 IMPLANT
DEVICE DUBIN SPECIMEN MAMMOGRA (MISCELLANEOUS) ×1 IMPLANT
DRAPE LAPAROTOMY TRNSV 106X77 (MISCELLANEOUS) ×1 IMPLANT
ELECT CAUTERY BLADE TIP 2.5 (TIP) ×1 IMPLANT
ELECT REM PT RETURN 9FT ADLT (ELECTROSURGICAL) ×1 IMPLANT
ELECTRODE CAUTERY BLDE TIP 2.5 (TIP) ×1 IMPLANT
ELECTRODE REM PT RTRN 9FT ADLT (ELECTROSURGICAL) ×1 IMPLANT
GLOVE BIO SURGEON STRL SZ 6.5 (GLOVE) ×1 IMPLANT
GLOVE BIOGEL PI IND STRL 6.5 (GLOVE) ×1 IMPLANT
GOWN STRL REUS W/ TWL LRG LVL3 (GOWN DISPOSABLE) ×3 IMPLANT
KIT MARKER MARGIN INK (KITS) IMPLANT
KIT TURNOVER KIT A (KITS) ×1 IMPLANT
LABEL OR SOLS (LABEL) ×1 IMPLANT
MANIFOLD NEPTUNE II (INSTRUMENTS) ×1 IMPLANT
MARKER MARGIN CORRECT CLIP (MARKER) IMPLANT
NDL HYPO 22X1.5 SAFETY MO (MISCELLANEOUS) ×1 IMPLANT
NDL SAFETY ECLIPSE 18X1.5 (NEEDLE) IMPLANT
NEEDLE HYPO 22X1.5 SAFETY MO (MISCELLANEOUS) ×1 IMPLANT
PACK BASIN MINOR ARMC (MISCELLANEOUS) ×1 IMPLANT
RETRACTOR RING XSMALL (MISCELLANEOUS) IMPLANT
RTRCTR WOUND ALEXIS 13CM XS SH (MISCELLANEOUS) ×1 IMPLANT
SHEATH BREAST BIOPSY SKIN MKR (SHEATH) IMPLANT
SUT MNCRL 4-0 27 PS-2 XMFL (SUTURE) ×1 IMPLANT
SUT SILK 2 0 SH (SUTURE) IMPLANT
SUT VIC AB 3-0 SH 27X BRD (SUTURE) ×1 IMPLANT
SUTURE MNCRL 4-0 27XMF (SUTURE) ×1 IMPLANT
SYR 10ML LL (SYRINGE) ×1 IMPLANT
TRAP FLUID SMOKE EVACUATOR (MISCELLANEOUS) ×1 IMPLANT
TRAP NEPTUNE SPECIMEN COLLECT (MISCELLANEOUS) ×1 IMPLANT
WATER STERILE IRR 1000ML POUR (IV SOLUTION) ×1 IMPLANT
WATER STERILE IRR 500ML POUR (IV SOLUTION) ×1 IMPLANT

## 2024-02-27 NOTE — Anesthesia Preprocedure Evaluation (Signed)
 Anesthesia Evaluation  Patient identified by MRN, date of birth, ID band Patient awake    Reviewed: Allergy & Precautions, H&P , NPO status , Patient's Chart, lab work & pertinent test results, reviewed documented beta blocker date and time   History of Anesthesia Complications Negative for: history of anesthetic complications  Airway Mallampati: I  TM Distance: >3 FB Neck ROM: full    Dental  (+) Dental Advidsory Given, Teeth Intact   Pulmonary neg pulmonary ROS   Pulmonary exam normal breath sounds clear to auscultation       Cardiovascular Exercise Tolerance: Good negative cardio ROS Normal cardiovascular exam Rhythm:regular Rate:Normal     Neuro/Psych negative neurological ROS  negative psych ROS   GI/Hepatic Neg liver ROS,GERD  ,,  Endo/Other  negative endocrine ROS    Renal/GU negative Renal ROS  negative genitourinary   Musculoskeletal   Abdominal   Peds  Hematology negative hematology ROS (+)   Anesthesia Other Findings Past Medical History: 1999: Abnormal Pap smear of cervix     Comment:  LGSIL 2013; 2/19: BRCA negative     Comment:  BRCA neg; MyRisk neg No date: Cancer (HCC)     Comment:  rt breast No date: Complication of anesthesia     Comment:  woke up during wisdom teeth surgery No date: Family history of breast cancer     Comment:  has tested negative for HBOC in 2013/ negative Myrisk               test 2019/ lifetime risk 24.6% No date: GERD (gastroesophageal reflux disease) No date: Goiter No date: Headache     Comment:  cluster HA No date: History of chicken pox No date: Increased risk of breast cancer     Comment:  IBIS=25%, riskscore=24.6% No date: Interstitial cystitis 10/2020: Screening for colon cancer     Comment:  Cologuard NEG; repeat after 3 yrs No date: Shingles   Reproductive/Obstetrics negative OB ROS                             Anesthesia  Physical Anesthesia Plan  ASA: 2  Anesthesia Plan: General   Post-op Pain Management:    Induction: Intravenous  PONV Risk Score and Plan: 3 and Ondansetron, Dexamethasone, Midazolam and Treatment may vary due to age or medical condition  Airway Management Planned: LMA  Additional Equipment:   Intra-op Plan:   Post-operative Plan: Extubation in OR  Informed Consent: I have reviewed the patients History and Physical, chart, labs and discussed the procedure including the risks, benefits and alternatives for the proposed anesthesia with the patient or authorized representative who has indicated his/her understanding and acceptance.     Dental Advisory Given  Plan Discussed with: Anesthesiologist, CRNA and Surgeon  Anesthesia Plan Comments:        Anesthesia Quick Evaluation

## 2024-02-27 NOTE — Interval H&P Note (Signed)
 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  The various methods of treatment have been discussed with the patient and family. After consideration of risks, benefits and other options for treatment, the patient has consented to  Procedure(s) with comments: BREAST LUMPECTOMY (Right) - w/ RF tag BIOPSY, LYMPH NODE, SENTINEL (Right) as a surgical intervention.  The patient's history has been reviewed, patient examined, no change in status, stable for surgery.  I have reviewed the patient's chart and labs.  Questions were answered to the patient's satisfaction.     Carolan Shiver

## 2024-02-27 NOTE — Transfer of Care (Signed)
 Immediate Anesthesia Transfer of Care Note  Patient: Amanda Coffey  Procedure(s) Performed: BREAST LUMPECTOMY (Right: Breast) BIOPSY, LYMPH NODE, SENTINEL (Right: Breast)  Patient Location: PACU  Anesthesia Type:General  Level of Consciousness: awake and drowsy  Airway & Oxygen Therapy: Patient Spontanous Breathing and Patient connected to face mask oxygen  Post-op Assessment: Report given to RN and Post -op Vital signs reviewed and stable  Post vital signs: Reviewed and stable  Last Vitals:  Vitals Value Taken Time  BP 120/73 02/27/24 1130  Temp 36.3 C 02/27/24 1124  Pulse 82 02/27/24 1136  Resp 15 02/27/24 1136  SpO2 100 % 02/27/24 1136  Vitals shown include unfiled device data.  Last Pain:  Vitals:   02/27/24 1130  TempSrc:   PainSc: Asleep         Complications: No notable events documented.  Pt transported to PACU with circ RN and report given to PACU RN. Patent airway, breathing spontaneously on 6L O2 via FM. Pt awake but sleepy, appears comfortable. VSS.

## 2024-02-27 NOTE — Op Note (Signed)
 Preoperative diagnosis: Right breast carcinoma.  Postoperative diagnosis: Same.   Procedure: Right radiofrequency tag-localized partial mastectomy.                      Right Axillary Sentinel Lymph node biopsy  Anesthesia: GETA  Surgeon: Dr. Hazle Quant  Wound Classification: Clean  Indications: Patient is a 48 y.o. female with a nonpalpable right breast mass noted on mammography with core biopsy demonstrating invasive ductal carcinoma requires radiofrequency tag-localized partial mastectomy for treatment with sentinel lymph node biopsy.   Findings: 1. Specimen mammography shows marker and tag on specimen 2. Pathology call refers gross examination of margins was grossly clear 3. No other palpable mass or lymph node identified.   Description of procedure: Preoperative radiofrequency tag localization was performed by radiology. In the nuclear medicine suite, the subareolar region was injected with Tc-99 sulfur colloid. Localization studies were reviewed. The patient was taken to the operating room and placed supine on the operating table, and after general anesthesia the right chest and axilla were prepped and draped in the usual sterile fashion. A time-out was completed verifying correct patient, procedure, site, positioning, and implant(s) and/or special equipment prior to beginning this procedure.  By comparing the localization studies and interrogation with [facility] device, the probable trajectory and location of the mass was visualized. A circumareolar skin incision was planned in such a way as to minimize the amount of dissection to reach the mass.  The skin incision was made. Flaps were raised and the location of the tag was confirmed with [facility] device confirmed. A 2-0 silk figure-of-eight stay suture was placed and used for retraction. Dissection was then taken down circumferentially, taking care to include the entire localizing tag and a wide margin of grossly normal  tissue. The specimen and entire localizing tag were removed. The specimen was oriented and sent to radiology with the localization studies. Confirmation was received that the entire target lesion had been resected. The wound was irrigated. Hemostasis was checked. The wound was closed with interrupted sutures of 3-0 Vicryl and a subcuticular suture of Monocryl 3-0. No attempt was made to close the dead space.   A hand-held gamma probe was used to identify the location of the hottest spot in the axilla. An incision was made around the caudal axillary hairline. Dissection was carried down until subdermal facias was advanced. The probe was placed and again, the point of maximal count was found. Dissection continue until nodule was identified. The probe was placed in contact with the node. The node was excised in its entirety.  No additional hot spots were identified. No clinically abnormal nodes were palpated. The procedure was terminated. Hemostasis was achieved and the wound closed in layers with deep interrupted 3-0 Vicryl and skin was closed with subcuticular suture of Monocryl 3-0.  The patient tolerated the procedure well and was taken to the postanesthesia care unit in stable condition.   Sentinel Node Biopsy Synoptic Operative Report  Operation performed with curative intent:Yes  Tracer(s) used to identify sentinel nodes in the upfront surgery (non-neoadjuvant) setting (select all that apply):Radioactive Tracer  Tracer(s) used to identify sentinel nodes in the neoadjuvant setting (select all that apply):N/A  All nodes (colored or non-colored) present at the end of a dye-filled lymphatic channel were removed:N/A  All significantly radioactive nodes were removed:Yes  All palpable suspicious nodes were removed:N/A  Biopsy-proven positive nodes marked with clips prior to chemotherapy were identified and removed:N/A  Specimen: Right Breast mass  Right axillary Sentinel Lymph  node  Complications: None  Estimated Blood Loss: 10 mL

## 2024-02-27 NOTE — Anesthesia Procedure Notes (Signed)
 Procedure Name: LMA Insertion Date/Time: 02/27/2024 9:57 AM  Performed by: Darcey Nora, CRNAPre-anesthesia Checklist: Patient identified, Patient being monitored, Timeout performed, Emergency Drugs available and Suction available Patient Re-evaluated:Patient Re-evaluated prior to induction Oxygen Delivery Method: Circle system utilized Preoxygenation: Pre-oxygenation with 100% oxygen Induction Type: IV induction Ventilation: Mask ventilation without difficulty LMA: LMA inserted LMA Size: 5.0 Tube type: Oral Number of attempts: 1 Placement Confirmation: positive ETCO2 and breath sounds checked- equal and bilateral Tube secured with: Tape Dental Injury: Teeth and Oropharynx as per pre-operative assessment  Comments: LMA 5 placed atraumatically x1 attempt. Dentition unchanged from preop baseline.

## 2024-02-27 NOTE — Discharge Instructions (Signed)

## 2024-02-28 ENCOUNTER — Ambulatory Visit: Admission: RE | Admit: 2024-02-28 | Source: Home / Self Care | Admitting: Gastroenterology

## 2024-02-28 ENCOUNTER — Other Ambulatory Visit: Payer: Self-pay | Admitting: Pathology

## 2024-02-28 ENCOUNTER — Encounter: Payer: Self-pay | Admitting: General Surgery

## 2024-02-28 LAB — SURGICAL PATHOLOGY

## 2024-02-28 SURGERY — COLONOSCOPY
Anesthesia: General

## 2024-03-01 ENCOUNTER — Encounter: Payer: Self-pay | Admitting: *Deleted

## 2024-03-01 NOTE — Progress Notes (Signed)
 Oncotype Dx order [id number] submitted online on biopsy specimen as it was larger then surgical specimen.

## 2024-03-02 NOTE — Anesthesia Postprocedure Evaluation (Signed)
 Anesthesia Post Note  Patient: Amanda Coffey  Procedure(s) Performed: BREAST LUMPECTOMY (Right: Breast) BIOPSY, LYMPH NODE, SENTINEL (Right: Breast)  Patient location during evaluation: PACU Anesthesia Type: General Level of consciousness: awake and alert Pain management: pain level controlled Vital Signs Assessment: post-procedure vital signs reviewed and stable Respiratory status: spontaneous breathing, nonlabored ventilation, respiratory function stable and patient connected to nasal cannula oxygen Cardiovascular status: blood pressure returned to baseline and stable Postop Assessment: no apparent nausea or vomiting Anesthetic complications: no   No notable events documented.   Last Vitals:  Vitals:   02/27/24 1215 02/27/24 1233  BP: 119/68 (!) 147/91  Pulse: 82 90  Resp: 11 15  Temp: 36.4 C (!) 36.2 C  SpO2: 95% 97%    Last Pain:  Vitals:   02/28/24 0820  TempSrc:   PainSc: 1                  Lenard Simmer

## 2024-03-05 ENCOUNTER — Other Ambulatory Visit

## 2024-03-05 NOTE — Progress Notes (Signed)
 Tumor Board Documentation  Amanda Coffey was presented by Dr.Finnegan at our Tumor Board on 03/05/2024, which included representatives from medical oncology, radiation oncology, surgical, radiology, pathology, navigation, research.  Amanda Coffey currently presents as a current patient with history of the following treatments: surgical intervention(s).  Additionally, we reviewed previous medical and familial history, history of present illness, and recent lab results along with all available histopathologic and imaging studies. The tumor board considered available treatment options and made the following recommendations: Adjuvant radiation, Hormonal therapy    The following procedures/referrals were also placed: No orders of the defined types were placed in this encounter.   Clinical Trial Status:     Staging used: AJCC Staging: T: T1b N: N0        National site-specific guidelines   were discussed with respect to the case.  Tumor board is a meeting of clinicians from various specialty areas who evaluate and discuss patients for whom a multidisciplinary approach is being considered. Final determinations in the plan of care are those of the provider(s). The responsibility for follow up of recommendations given during tumor board is that of the provider.   Today's extended care, comprehensive team conference, Amanda Coffey was not present for the discussion and was not examined.   Multidisciplinary Tumor Board is a multidisciplinary case peer review process.  Decisions discussed in the Multidisciplinary Tumor Board reflect the opinions of the specialists present at the conference without having examined the patient.  Ultimately, treatment and diagnostic decisions rest with the primary provider(s) and the patient.

## 2024-03-12 ENCOUNTER — Encounter: Payer: Self-pay | Admitting: Oncology

## 2024-03-14 ENCOUNTER — Ambulatory Visit: Admitting: Occupational Therapy

## 2024-03-14 DIAGNOSIS — R293 Abnormal posture: Secondary | ICD-10-CM | POA: Diagnosis not present

## 2024-03-14 NOTE — Therapy (Signed)
 OUTPATIENT OCCUPATIONAL THERAPY BREAST CANCER POSTOP VISIT   Patient Name: Amanda Coffey MRN: [id number] DOB:1976-07-13, 48 y.o., female Today's Date: 03/14/2024  END OF SESSION:  OT End of Session - 03/14/24 1020     Visit Number 2    Number of Visits 6    Date for OT Re-Evaluation 05/16/24    OT Start Time 0928    OT Stop Time 0952    OT Time Calculation (min) 24 min    Activity Tolerance Patient tolerated treatment well    Behavior During Therapy [facility] for tasks assessed/performed             Past Medical History:  Diagnosis Date   Abnormal Pap smear of cervix 1999   LGSIL   BRCA negative 2013; 2/19   BRCA neg; MyRisk neg   Cancer (HCC)    rt breast   Complication of anesthesia    woke up during wisdom teeth surgery   Family history of breast cancer    has tested negative for HBOC in 2013/ negative Myrisk test 2019/ lifetime risk 24.6%   GERD (gastroesophageal reflux disease)    Goiter    Headache    cluster HA   History of chicken pox    Increased risk of breast cancer    IBIS=25%, riskscore=24.6%   Interstitial cystitis    Screening for colon cancer 10/2020   Cologuard NEG; repeat after 3 yrs   Shingles    Past Surgical History:  Procedure Laterality Date   BREAST BIOPSY Right 02/15/2024   US  Bx, ribbon clip,INVASIVE MAMMARY CARCINOMA, NO SPECIAL TYPE. - OVERALL GRADE: 1. - LYMPHOVASCULAR INVASION: NOT IDENTIFIED. - CANCER LENGTH: 9 MM - CALCIFICATIONS: NOT IDENTIFIED. - DUCTAL CARCINOMA IN SITU: PRESENT, INTERMEDIATE GRADE.   BREAST BIOPSY Right 02/15/2024   US  RT BREAST BX W LOC DEV 1ST LESION IMG BX SPEC US  GUIDE 02/15/2024 [facility]   BREAST LUMPECTOMY Right 02/27/2024   Procedure: BREAST LUMPECTOMY;  Surgeon: Eldred Grego, MD;  Location: [facility];  Service: General;  Laterality: Right;  w/ RF tag   COLPOSCOPY  1999   LGSIL Pap, mild dysplasia, VAIN 1   DILATION AND CURETTAGE OF UTERUS  2005   due to missed abortion   HYSTEROSCOPY  WITH D & C N/A 06/27/2015   Procedure: DILATATION AND CURETTAGE /HYSTEROSCOPY;  Surgeon: Margarie Shay Ward, MD;  Location: [facility]S;  Service: Gynecology;  Laterality: N/A;   savi tag placement Right 02/23/2024   SENTINEL NODE BIOPSY Right 02/27/2024   Procedure: BIOPSY, LYMPH NODE, SENTINEL;  Surgeon: Eldred Grego, MD;  Location: [facility]S;  Service: General;  Laterality: Right;   WISDOM TOOTH EXTRACTION     Patient Active Problem List   Diagnosis Date Noted   Invasive ductal carcinoma of right breast (HCC) 02/22/2024   Acute non-recurrent pansinusitis 12/17/2020   History of shingles 12/17/2020   Skin sensitivity 12/17/2020   History of COVID-19 12/17/2020   Heat intolerance 11/03/2020   Increased risk of breast cancer    Diffuse nontoxic goiter 06/07/2018   Family history of breast cancer 01/12/2018   Abnormal blood chemistry 04/20/2016   Abnormal liver enzymes 04/20/2016   Interstitial cystitis 04/20/2016   Ganglion cyst of wrist 04/20/2016   Occipital mass 04/20/2016   Herpes zoster without complication 07/08/2009    PCP: [facility]  REFERRING PROVIDER: Dr Adrian Alba  REFERRING DIAG: R breast Cancer  THERAPY DIAG:  Abnormal posture  Rationale for Evaluation and Treatment: Rehabilitation  ONSET DATE: 02/15/24  SUBJECTIVE:                                                                                                                                                                                           SUBJECTIVE STATEMENT: Doing well after surgery.  I can feel a slight pull when reaching overhead.  Scar feels good. PERTINENT HISTORY:  Patient was diagnosed with right  breast cancer - had  R lumpectomy with 1 lymph node that was negative by Dr Charmel Cooter on 02/27/24 and plan is radiation no chemo. PATIENT GOALS:   reduce lymphedema risk and learn post op HEP.   PAIN:  Are you having pain?  Slight pull over lumpectomy scar with endrange shoulder flexion abduction on the  right  PRECAUTIONS: Active CA     HAND DOMINANCE: right  WEIGHT BEARING RESTRICTIONS: No  FALLS:  Has patient fallen in last 6 months? 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  Right shoulder flexion 170 degrees and abduction 160 degrees with a slight pull over lumpectomy scar.  Less than 2/10 pain  A/PROM RIGHT   eval   Shoulder extension WNL  Shoulder flexion WNL  Shoulder abduction WNL  Shoulder internal rotation WNL  Shoulder external rotation WNL    (Blank rows = not tested)  A/PROM LEFT   eval  Shoulder extension WNL  Shoulder flexion WNL  Shoulder abduction WNL  Shoulder internal rotation WNL   Shoulder external rotation WNL    (Blank rows = not tested)  CERVICAL AROM: All within normal limits:   UPPER EXTREMITY STRENGTH: Shoulder flexion and abduction 5/5 strength.  External and internal rotation 4+/5  LYMPHEDEMA ASSESSMENTS:   LANDMARK RIGHT   eval  10 cm proximal to olecranon process 31  Olecranon process 26.4  10 cm proximal to ulnar styloid process 21.2  Just proximal to ulnar styloid process   Across hand at thumb web space   At base of 2nd digit   (Blank rows = not tested)  LANDMARK LEFT   eval  10 cm proximal to olecranon process 30.5  Olecranon process 26.5  10 cm proximal to ulnar styloid process 21.5  Just proximal to ulnar styloid process   Across hand at thumb web space   At base of 2nd digit   (Blank rows = not tested)  L-DEX LYMPHEDEMA SCREENING:  The patient was assessed using the L-Dex machine today to produce a lymphedema index baseline score. The patient will be reassessed on a  regular basis (typically every 3 months) to obtain new L-Dex scores. If the score is > 6.5 points away from his/her baseline score indicating onset of subclinical lymphedema, it will be recommended to wear a compression garment for 4 weeks, 12 hours per day and then be reassessed. If the score continues to be > 6.5 points from baseline at reassessment, we will initiate lymphedema treatment. Assessing in this manner has a 95% rate of preventing clinically significant lymphedema.   L-DEX FLOWSHEETS - 03/14/24 1000       L-DEX LYMPHEDEMA SCREENING   Measurement Type Unilateral    L-DEX MEASUREMENT EXTREMITY Upper Extremity    POSITION  Standing    DOMINANT SIDE Right    At Risk Side Right    BASELINE SCORE (UNILATERAL) 1.3    L-DEX SCORE (UNILATERAL) 5.3    VALUE CHANGE (UNILAT) 4            Today's treatment session: 03/14/24 Assess patient's active range of motion for right shoulder flexion abduction external rotation. Great progress with patient being just over 2 weeks postop. Reminded patient to keep pain or pull at a 1-2/10 discomfort. Change patient's home program to active assisted range of motion on the wall for shoulder flexion and abduction 10 reps to 3 times a day. Patient feels slight pull at endrange for abduction. Patient is supine able to get into radiation position.  With no discomfort or pull. Circumference about the same than at evaluation. Patient do have a little bit of postop swelling at lumpectomy scar but no seroma noticed. Done L-Dex score.  Within normal limits. Did provide patient with a handout on lymphedema for signs and symptoms as well as precautions and prevention.   PATIENT EDUCATION:  Education details: Lymphedema risk reduction will be done after surgery and post op shoulder/posture HEP done Person educated: Patient Education method: Explanation, Demonstration, Handout Education comprehension: Patient verbalized understanding and returned  demonstration   ASSESSMENT:  CLINICAL IMPRESSION: Patient is following up with me after having right lumpectomy with 1 lymph node removed on 02/27/2024 by Dr. Charmel Cooter.  Patient present with great progress in right shoulder active range of motion.  Patient's home program was changed to active assisted range of motion the wall but reinforced with patient to keep it under 1-2/10 pain.  Patient still to be careful for pulling pushing lifting and carrying.  Can check with surgeon next visit.  Patient L-Dex score within normal limits.  Patient can follow-up at the end of radiation with breast navigator to repeat her L-Dex score and assess if any OT needs is necessary.  And after that she can be screen every 3 months for 2 years to detect subclinical lymphedema.  Pt will benefit from skilled therapeutic intervention to improve on the following deficits: Decreased knowledge of precautions and lymphedema education, impaired UE functional use, pain, decreased ROM, postural dysfunction.   OT treatment/interventions: ADL/self-care home management, pt/family education, therapeutic exercise,manual therapy  REHAB POTENTIAL: Good  CLINICAL DECISION MAKING: Stable/uncomplicated  EVALUATION COMPLEXITY: Low   GOALS: Goals reviewed with patient? YES  LONG TERM GOALS: (STG=LTG)    Name Target Date Goal status  1 Pt will be able to verbalize understanding of pertinent lymphedema risk reduction practices relevant to her dx specifically related to skin care.  Baseline:  No knowledge 6 to 12 weeks  Met  2 Pt will be able to return demo and/or verbalize understanding of the post op HEP related to regaining shoulder ROM. Baseline:  No knowledge Today Met       4 Pt will demo she has regained full shoulder ROM and function post operatively compared to baselines.  Baseline: See objective measurements taken today. 6 to 12 weeks Progressing    PLAN:  OT FREQUENCY/DURATION: EVAL and 5 follow up appointment 12  weeks  PLAN FOR NEXT SESSION: will reassess 2-3 weeks post op to determine needs.   Patient will follow up at outpatient cancer rehab 2-3 weeks following surgery.  If the patient requires occupational therapy at that time, a specific plan will be dictated and sent to the referring physician for approval. T Occupational Therapy Information for After Breast Cancer Surgery/Treatment:  Lymphedema is a swelling condition that you may be at risk for in your arm if you have lymph nodes removed from the armpit area.  After a sentinel node biopsy, the risk is approximately 5-9% and is higher after an axillary node dissection.  There is treatment available for this condition and it is not life-threatening.  Contact your physician or occupational therapist with concerns. You may begin the 4 shoulder/posture exercises (see additional sheet) when permitted by your physician (typically a week after surgery).  If you have drains, you may need to wait until those are removed before beginning range of motion exercises.  A general recommendation is to not lift your arms above shoulder height until drains are removed.  These exercises should be done to your tolerance and gently.  This is not a "no pain/no gain" type of recovery so listen to your body and stretch into the range of motion that you can tolerate, stopping if you have pain.  If you are having immediate reconstruction, ask your plastic surgeon about doing exercises as he or she may want you to wait. .  While undergoing any medical procedure or treatment, try to avoid blood pressure being taken or needle sticks from occurring on the arm on the side of cancer.   This recommendation begins after surgery and continues for the rest of your life.  This may help reduce your risk of getting lymphedema (swelling in your arm). An excellent resource for those seeking information on lymphedema is the National Lymphedema Network's web site. It can be accessed at  www.lymphnet.org If you notice swelling in your hand, arm or breast at any time following surgery (even if it is many years from now), please contact your doctor or occupational therapist to discuss this.  Lymphedema can be treated at any time but it is easier for you if it is treated early on.  If you feel like your shoulder motion is not returning to normal in a reasonable amount of time, please contact your surgeon or occupational therapist.  [facility] [phone]. [address] [place], [place] [address]       Heloise Lobo, OTR/L,CLT 03/14/2024, 10:22 AM

## 2024-03-19 ENCOUNTER — Encounter: Payer: Self-pay | Admitting: Oncology

## 2024-03-19 ENCOUNTER — Inpatient Hospital Stay: Admitting: Oncology

## 2024-03-19 ENCOUNTER — Ambulatory Visit
Admission: RE | Admit: 2024-03-19 | Discharge: 2024-03-19 | Disposition: A | Discharge status: Home / Self Care | Source: Ambulatory Visit | Attending: Radiation Oncology | Admitting: Radiation Oncology

## 2024-03-19 VITALS — BP 118/81 | HR 86 | Temp 97.7°F | Resp 16 | Ht 67.0 in | Wt 198.8 lb

## 2024-03-19 DIAGNOSIS — K219 Gastro-esophageal reflux disease without esophagitis: Secondary | ICD-10-CM | POA: Insufficient documentation

## 2024-03-19 DIAGNOSIS — Z510 Encounter for antineoplastic radiation therapy: Secondary | ICD-10-CM | POA: Insufficient documentation

## 2024-03-19 DIAGNOSIS — Z800 Family history of malignant neoplasm of digestive organs: Secondary | ICD-10-CM | POA: Insufficient documentation

## 2024-03-19 DIAGNOSIS — Z803 Family history of malignant neoplasm of breast: Secondary | ICD-10-CM | POA: Insufficient documentation

## 2024-03-19 DIAGNOSIS — C50411 Malignant neoplasm of upper-outer quadrant of right female breast: Secondary | ICD-10-CM | POA: Insufficient documentation

## 2024-03-19 DIAGNOSIS — N3010 Interstitial cystitis (chronic) without hematuria: Secondary | ICD-10-CM | POA: Insufficient documentation

## 2024-03-19 DIAGNOSIS — C50919 Malignant neoplasm of unspecified site of unspecified female breast: Secondary | ICD-10-CM | POA: Diagnosis not present

## 2024-03-19 DIAGNOSIS — Z170 Estrogen receptor positive status [ER+]: Secondary | ICD-10-CM | POA: Insufficient documentation

## 2024-03-19 DIAGNOSIS — C50911 Malignant neoplasm of unspecified site of right female breast: Secondary | ICD-10-CM

## 2024-03-19 NOTE — Consult Note (Signed)
 NEW PATIENT EVALUATION  Name: Amanda Coffey  MRN: [id number]  Date:   03/19/2024     DOB: 12/28/1975   This 48 y.o. female patient presents to the clinic for initial evaluation of stage Ia.  (pT1b pN0 SN M0) ER/PR positive invasive mammary carcinoma of the right breast status post wide local excision and sentinel node biopsy  REFERRING PHYSICIAN: Shellie Dials, MD  CHIEF COMPLAINT:  Chief Complaint  Patient presents with   Breast Cancer    DIAGNOSIS: The encounter diagnosis was Invasive ductal carcinoma of right breast (HCC).   PREVIOUS INVESTIGATIONS:  Mammogram and ultrasound reviewed Clinical notes reviewed Pathology reports reviewed Case presented at breast tumor conference  HPI: Patient is a 48 year old female who presents with an abnormal mammogram of her right breast showing area of asymmetry in the right upper outer breast 11 o'clock position 5 cm from the nipple.  This measured 7 x 5 x 6 mm.  By ultrasound there was no suspicious right axillary adenopathy.  She underwent targeted ultrasound which was positive for invasive mammary.  She went on to have a wide local excision and sentinel node biopsy for a 9 mm invasive mammary carcinoma overall grade 1.  Margins were clear at 1 cm.  1 sentinel lymph node was negative for malignancy.  She had 1 sentinel lymph node examined which was negative.  Tumor was strongly ER/PR positive HER2/neu not overexpressed.  She had an Oncotype Dx performed with a recurrence score of 17 she will not receive systemic treatment she is seen today for radiation oncology opinion.  She is doing well.  She specifically denies breast tenderness cough or bone pain.  PLANNED TREATMENT REGIMEN: Hypofractionated whole breast radiation  PAST MEDICAL HISTORY:  has a past medical history of Abnormal Pap smear of cervix (1999), BRCA negative (2013; 2/19), Cancer (HCC), Complication of anesthesia, Family history of breast cancer, GERD (gastroesophageal reflux  disease), Goiter, Headache, History of chicken pox, Increased risk of breast cancer, Interstitial cystitis, Screening for colon cancer (10/2020), and Shingles.    PAST SURGICAL HISTORY:  Past Surgical History:  Procedure Laterality Date   BREAST BIOPSY Right 02/15/2024   US  Bx, ribbon clip,INVASIVE MAMMARY CARCINOMA, NO SPECIAL TYPE. - OVERALL GRADE: 1. - LYMPHOVASCULAR INVASION: NOT IDENTIFIED. - CANCER LENGTH: 9 MM - CALCIFICATIONS: NOT IDENTIFIED. - DUCTAL CARCINOMA IN SITU: PRESENT, INTERMEDIATE GRADE.   BREAST BIOPSY Right 02/15/2024   US  RT BREAST BX W LOC DEV 1ST LESION IMG BX SPEC US  GUIDE 02/15/2024 [facility]   BREAST LUMPECTOMY Right 02/27/2024   Procedure: BREAST LUMPECTOMY;  Surgeon: Eldred Grego, MD;  Location: [facility];  Service: General;  Laterality: Right;  w/ RF tag   COLPOSCOPY  1999   LGSIL Pap, mild dysplasia, VAIN 1   DILATION AND CURETTAGE OF UTERUS  2005   due to missed abortion   HYSTEROSCOPY WITH D & C N/A 06/27/2015   Procedure: DILATATION AND CURETTAGE /HYSTEROSCOPY;  Surgeon: Margarie Shay Ward, MD;  Location: [facility];  Service: Gynecology;  Laterality: N/A;   savi tag placement Right 02/23/2024   SENTINEL NODE BIOPSY Right 02/27/2024   Procedure: BIOPSY, LYMPH NODE, SENTINEL;  Surgeon: Eldred Grego, MD;  Location: [facility];  Service: General;  Laterality: Right;   WISDOM TOOTH EXTRACTION      FAMILY HISTORY: family history includes Breast cancer (age of onset: 71) in her cousin; Breast cancer (age of onset: 13) in her cousin; Breast cancer (age of onset: 23) in her mother; Breast  cancer (age of onset: 71) in her paternal grandmother; CAD in her paternal grandfather; Colon cancer (age of onset: 65) in her paternal uncle; Crohn's disease in her father and paternal grandmother; Hypertension in her maternal aunt and mother; Multiple myeloma in her maternal aunt; Scoliosis in her sister.  SOCIAL HISTORY:  reports that she has never smoked. She has  never used smokeless tobacco. She reports that she does not drink alcohol and does not use drugs.  ALLERGIES: Patient has no known allergies.  MEDICATIONS:  Current Outpatient Medications  Medication Sig Dispense Refill   ibuprofen  (ADVIL ) 200 MG tablet Take 400 mg by mouth every 8 (eight) hours as needed (headaches.).     levocetirizine (XYZAL) 5 MG tablet Take 5 mg by mouth every evening.     Multiple Vitamins-Minerals (MULTIVITAMIN WITH MINERALS) tablet Take 1 tablet by mouth in the morning.     No current facility-administered medications for this encounter.    ECOG PERFORMANCE STATUS:  0 - Asymptomatic  REVIEW OF SYSTEMS: Patient denies any weight loss, fatigue, weakness, fever, chills or night sweats. Patient denies any loss of vision, blurred vision. Patient denies any ringing  of the ears or hearing loss. No irregular heartbeat. Patient denies heart murmur or history of fainting. Patient denies any chest pain or pain radiating to her upper extremities. Patient denies any shortness of breath, difficulty breathing at night, cough or hemoptysis. Patient denies any swelling in the lower legs. Patient denies any nausea vomiting, vomiting of blood, or coffee ground material in the vomitus. Patient denies any stomach pain. Patient states has had normal bowel movements no significant constipation or diarrhea. Patient denies any dysuria, hematuria or significant nocturia. Patient denies any problems walking, swelling in the joints or loss of balance. Patient denies any skin changes, loss of hair or loss of weight. Patient denies any excessive worrying or anxiety or significant depression. Patient denies any problems with insomnia. Patient denies excessive thirst, polyuria, polydipsia. Patient denies any swollen glands, patient denies easy bruising or easy bleeding. Patient denies any recent infections, allergies or URI. Patient "s visual fields have not changed significantly in recent time.    PHYSICAL EXAM: LMP 02/13/2024 (Approximate)  Patient status post wide local excision of the right breast incision is well-healed no dominant masses noted in either breast no axillary or supraclavicular adenopathy is appreciated.  Well-developed well-nourished patient in NAD. HEENT reveals PERLA, EOMI, discs not visualized.  Oral cavity is clear. No oral mucosal lesions are identified. Neck is clear without evidence of cervical or supraclavicular adenopathy. Lungs are clear to A&P. Cardiac examination is essentially unremarkable with regular rate and rhythm without murmur rub or thrill. Abdomen is benign with no organomegaly or masses noted. Motor sensory and DTR levels are equal and symmetric in the upper and lower extremities. Cranial nerves II through XII are grossly intact. Proprioception is intact. No peripheral adenopathy or edema is identified. No motor or sensory levels are noted. Crude visual fields are within normal range.  LABORATORY DATA: Pathology report reviewed    RADIOLOGY RESULTS: Mammogram ultrasound reviewed compatible with above-stated findings   IMPRESSION: Stage Ia ER/PR positive invasive mammary carcinoma the right breast status post wide local excision and sentinel biopsy in 48 year old female  PLAN: At this time I have recommended hypofractionated course of whole breast radiation.  Her breast is somewhat large and will probably have more significant skin reaction over the course of treatment which I advised her of.  Will also boost her scar  another 1000 cGy using photon beam therapy.  Risks and benefits of treatment occluding skin reaction fatigue alteration of blood counts possible inclusion of superficial lung all were discussed in detail with the patient.  I have personally set up and ordered CT simulation for later this week.  Patient also will benefit from endocrine therapy after completion of radiation.  I would like to take this opportunity to thank you for allowing me  to participate in the care of your patient.Glenis Langdon, MD

## 2024-03-19 NOTE — Progress Notes (Signed)
 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  Oncotype Dx score 17, low risk.  INTERVAL HISTORY: Patient turns to clinic today for further evaluation, discussion of her final pathology results, and treatment planning.  She tolerated her lumpectomy well without significant side effects.  She currently feels well and is asymptomatic. She has no neurologic complaints.  She denies any recent fevers or illnesses.  She has a good appetite and denies weight loss.  She has no chest pain, shortness of breath, cough, or hemoptysis.  She denies any nausea, vomiting, constipation, or diarrhea.  She has no urinary complaints.  Patient offers no specific complaints today.  REVIEW OF SYSTEMS:   Review of Systems  Constitutional: Negative.  Negative for fever, malaise/fatigue and weight loss.  Respiratory: Negative.  Negative for cough, hemoptysis and shortness of breath.   Cardiovascular: Negative.  Negative for chest pain and leg swelling.  Gastrointestinal: Negative.  Negative for abdominal pain.  Genitourinary: Negative.  Negative for dysuria.  Musculoskeletal: Negative.  Negative for back pain.  Skin: Negative.  Negative for rash.  Neurological: Negative.  Negative for dizziness, focal weakness, weakness and headaches.  Psychiatric/Behavioral: Negative.  The patient is not nervous/anxious.     As per HPI. Otherwise, a complete review of systems is negative.  PAST MEDICAL HISTORY: Past Medical History:  Diagnosis Date   Abnormal Pap smear of cervix 1999   LGSIL   BRCA negative  2013; 2/19   BRCA neg; MyRisk neg   Cancer (HCC)    rt breast   Complication of anesthesia    woke up during wisdom teeth surgery   Family history of breast cancer    has tested negative for HBOC in 2013/ negative Myrisk test 2019/ lifetime risk 24.6%   GERD (gastroesophageal reflux disease)    Goiter    Headache    cluster HA   History of chicken pox    Increased risk of breast cancer    IBIS=25%, riskscore=24.6%   Interstitial cystitis    Screening for colon cancer 10/2020   Cologuard NEG; repeat after 3 yrs   Shingles     PAST SURGICAL HISTORY: Past Surgical History:  Procedure Laterality Date   BREAST BIOPSY Right 02/15/2024   US  Bx, ribbon clip,INVASIVE MAMMARY CARCINOMA, NO SPECIAL TYPE. - OVERALL GRADE: 1. - LYMPHOVASCULAR INVASION: NOT IDENTIFIED. - CANCER LENGTH: 9 MM - CALCIFICATIONS: NOT IDENTIFIED. - DUCTAL CARCINOMA IN SITU: PRESENT, INTERMEDIATE GRADE.   BREAST BIOPSY Right 02/15/2024   US  RT BREAST BX W LOC DEV 1ST LESION IMG BX SPEC US  GUIDE 02/15/2024 [facility]   BREAST LUMPECTOMY Right 02/27/2024   Procedure: BREAST LUMPECTOMY;  Surgeon: Eldred Grego, MD;  Location: [facility] ORS;  Service: General;  Laterality: Right;  w/ RF tag   COLPOSCOPY  1999   LGSIL Pap, mild dysplasia, VAIN 1   DILATION AND CURETTAGE OF UTERUS  2005   due to missed abortion   HYSTEROSCOPY WITH D & C N/A 06/27/2015   Procedure: DILATATION AND CURETTAGE /HYSTEROSCOPY;  Surgeon: Margarie Shay Ward, MD;  Location: [facility];  Service: Gynecology;  Laterality: N/A;   savi tag  placement Right 02/23/2024   SENTINEL NODE BIOPSY Right 02/27/2024   Procedure: BIOPSY, LYMPH NODE, SENTINEL;  Surgeon: Eldred Grego, MD;  Location: [facility];  Service: General;  Laterality: Right;   WISDOM TOOTH EXTRACTION      FAMILY HISTORY: Family History  Problem Relation Age of Onset   Hypertension Mother    Breast cancer Mother 3   Crohn's disease Father    Scoliosis Sister    Crohn's  disease Paternal Grandmother    Breast cancer Paternal Grandmother 79   CAD Paternal Grandfather        stent   Breast cancer Cousin 34       2nd cousin paternal side. died in 10-Apr-2003   Colon cancer Paternal Uncle 25   Breast cancer Cousin 40       2nd cousin on paternal side   Hypertension Maternal Aunt    Multiple myeloma Maternal Aunt     ADVANCED DIRECTIVES (Y/N):  N  HEALTH MAINTENANCE: Social History   Tobacco Use   Smoking status: Never   Smokeless tobacco: Never  Vaping Use   Vaping status: Never Used  Substance Use Topics   Alcohol use: No   Drug use: No     Colonoscopy:  PAP:  Bone density:  Lipid panel:  No Known Allergies  Current Outpatient Medications  Medication Sig Dispense Refill   ibuprofen  (ADVIL ) 200 MG tablet Take 400 mg by mouth every 8 (eight) hours as needed (headaches.).     levocetirizine (XYZAL) 5 MG tablet Take 5 mg by mouth every evening.     Multiple Vitamins-Minerals (MULTIVITAMIN WITH MINERALS) tablet Take 1 tablet by mouth in the morning.     No current facility-administered medications for this visit.    OBJECTIVE: Vitals:   03/19/24 0943  BP: 118/81  Pulse: 86  Resp: 16  Temp: 97.7 F (36.5 C)  SpO2: 98%     Body mass index is 31.14 kg/m.    ECOG FS:0 - Asymptomatic  General: Well-developed, well-nourished, no acute distress. Eyes: Pink conjunctiva, anicteric sclera. HEENT: Normocephalic, moist mucous membranes. Lungs: No audible wheezing or coughing. Heart: Regular rate and rhythm. Abdomen: Soft, nontender, no obvious distention. Musculoskeletal: No edema, cyanosis, or clubbing. Neuro: Alert, answering all questions appropriately. Cranial nerves grossly intact. Skin: No rashes or petechiae noted. Psych: Normal affect.  LAB RESULTS:  Lab Results  Component Value Date   NA 140 02/10/2024   K 4.0 02/10/2024   CL 105 02/10/2024   CO2 22 02/10/2024   GLUCOSE 90 02/10/2024   BUN 13 02/10/2024   CREATININE 0.74  02/10/2024   CALCIUM 9.2 02/10/2024   PROT 6.3 02/10/2024   ALBUMIN 4.1 02/10/2024   AST 18 02/10/2024   ALT 28 02/10/2024   ALKPHOS 83 02/10/2024   BILITOT 0.4 02/10/2024   GFRNONAA 104 12/27/2016   GFRAA 120 12/27/2016    Lab Results  Component Value Date   WBC 7.4 02/23/2024   NEUTROABS 3.8 04/23/2021   HGB 14.0 02/23/2024   HCT 40.4 02/23/2024   MCV 92.2 02/23/2024   PLT 397 02/23/2024     STUDIES: MM Breast Surgical Specimen Result Date: 02/27/2024 CLINICAL DATA:  Evaluate surgical specimen following lumpectomy for RIGHT breast cancer. EXAM: SPECIMEN RADIOGRAPH OF THE RIGHT BREAST COMPARISON:  Previous exam(s). FINDINGS: Status post excision of the RIGHT breast. The [facility] reflector and RIBBON shaped clip are present within the specimen. IMPRESSION: Specimen radiograph of the RIGHT breast. Electronically Signed   By: Susana Enter  Hu M.D.   On: 02/27/2024 10:42   NM Sentinel Node Inj-No Rpt (Breast) Result Date: 02/27/2024 Lymphoseek was injected by the Nuclear Medicine Technologist for sentinel lymph node localization.   US  RT RADIO FREQUENCY TAG LOC US  GUIDE Result Date: 02/23/2024 CLINICAL DATA:  Status post ultrasound-guided biopsy of the RIGHT breast demonstrating invasive mammary carcinoma (RIBBON clip). EXAM: NEEDLE LOCALIZATION OF THE RIGHT BREAST WITH ULTRASOUND GUIDANCE COMPARISON:  Previous exam(s). FINDINGS: Patient presents for needle localization prior to lumpectomy. I met with the patient and we discussed the procedure of needle localization including benefits and alternatives. We discussed the high likelihood of a successful procedure. We discussed the risks of the procedure, including infection, bleeding, tissue injury, and further surgery. Informed, written consent was given. The usual time-out protocol was performed immediately prior to the procedure. Using ultrasound guidance, sterile technique, 1% lidocaine  and a 7.5 cm SAVI SCOUT needle, the mass at 11 o'clock 5  cm from the nipple was localized using a inferolateral approach. Subsequent two view tomosynthesis mammogram was obtained. The images were marked for Dr. Dortha Gauss. IMPRESSION: Radar reflector localization of the RIGHT breast. No apparent complications. Electronically Signed   By: Clancy Crimes M.D.   On: 02/23/2024 16:33   MM 3D DIAGNOSTIC MAMMOGRAM UNILATERAL RIGHT BREAST Result Date: 02/23/2024 CLINICAL DATA:  Status post ultrasound-guided SAVI placement at site of biopsy-proven RIGHT breast malignancy (RIBBON clip). EXAM: DIAGNOSTIC RIGHT MAMMOGRAM POST PLACEMENT OF SAVI SCOUT COMPARISON:  Previous exam(s). ACR Breast Density Category c: The breasts are heterogeneously dense, which may obscure small masses. FINDINGS: Mammographic images were obtained following ultrasound guided placement of SAVI scout in the RIGHT breast. These demonstrate SAVI scout within the area of architectural distortion in the RIGHT upper outer breast, immediately adjacent to a RIBBON biopsy marking clip. IMPRESSION: Appropriate positioning of SAVI scout within the RIGHT breast architectural distortion at site of biopsy-proven malignancy. Final Assessment: Post Procedure Mammograms for [facility] placement Electronically Signed   By: Clancy Crimes M.D.   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  Oncotype Dx score 17, low risk.  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  Oncotype Dx score 17, low risk: Patient underwent lumpectomy on February 27, 2024.  Given her low risk Oncotype score she does not require adjuvant chemotherapy, but will benefit from adjuvant XRT and has an appointment with radiation oncology later this morning.  Patient thinks that she is perimenopausal, therefore she will benefit from letrozole for a total of 5 years at the conclusion of her XRT.  Return to  clinic at the completion of her XRT for further evaluation and initiation of letrozole.   Bone health: Patient will require baseline bone mineral density at the start of her letrozole. Genetic testing: Patient reports negative testing in the past.  I spent a total of 30 minutes reviewing chart data, face-to-face evaluation with the patient, counseling and coordination of care as detailed above.  Patient expressed understanding and was in agreement with this plan. She also understands that She can call clinic at any time with any questions, concerns, or complaints.    Cancer Staging  Invasive ductal carcinoma of right breast [facility]) Staging form: Breast, AJCC 8th Edition - Pathologic stage from 03/19/2024: Stage IA (pT1b, pN0, cM0, G2, ER+, PR+, HER2-, Oncotype DX score: 17) - Signed by Shellie Dials, MD on 03/19/2024  Stage prefix: Initial diagnosis Multigene prognostic tests performed: Oncotype DX Recurrence score range: Greater than or equal to 11 Histologic grading system: 3 grade system   Shellie Dials, MD   03/19/2024 12:27 PM

## 2024-03-20 ENCOUNTER — Encounter: Payer: Self-pay | Admitting: Obstetrics and Gynecology

## 2024-03-20 ENCOUNTER — Ambulatory Visit: Admitting: Obstetrics and Gynecology

## 2024-03-20 ENCOUNTER — Other Ambulatory Visit (HOSPITAL_COMMUNITY)
Admission: RE | Admit: 2024-03-20 | Discharge: 2024-03-20 | Disposition: A | Discharge status: Home / Self Care | Source: Ambulatory Visit | Attending: Obstetrics and Gynecology | Admitting: Obstetrics and Gynecology

## 2024-03-20 VITALS — BP 143/98 | HR 87 | Ht 67.0 in | Wt 200.0 lb

## 2024-03-20 DIAGNOSIS — N9089 Other specified noninflammatory disorders of vulva and perineum: Secondary | ICD-10-CM | POA: Diagnosis present

## 2024-03-20 DIAGNOSIS — L918 Other hypertrophic disorders of the skin: Secondary | ICD-10-CM

## 2024-03-20 DIAGNOSIS — D072 Carcinoma in situ of vagina: Secondary | ICD-10-CM

## 2024-03-20 DIAGNOSIS — D071 Carcinoma in situ of vulva: Secondary | ICD-10-CM | POA: Diagnosis not present

## 2024-03-20 NOTE — Patient Instructions (Signed)
 I value your feedback and you entrusting [place] with your care. If you get a [place] patient survey, I would appreciate you taking the time to let us know about your experience today. Thank you! ? ? ?

## 2024-03-20 NOTE — Progress Notes (Signed)
 Pcp, No   Chief Complaint  Patient presents with   Follow-up    HPI:      Amanda Coffey is a 48 y.o. [id number] whose LMP was Patient's last menstrual period was 03/09/2024 (exact date)., presents today for f/u from RT labial lesion treated with TCA 3/25. Area was pale/white and looked almost warty; turned white with TCA tx. Since unusual and given pt's hx of HPV--VAIN 1/LGSIL 1999 on colpo bx with Dr. Lucille Saas, pt RTO for f/u. Area hasn't resolved so vulvar bx today.   Pt also with skin tag LT labia minora with thicker base. Recommended she RTO with Dr. Luster Salters for removal in case cautery needed. Pt has $175 co-pay so she would be happy if I could remove today.   Pt is also being followed by oncology for recent RT breast cancer dx; getting ready to start radiation.   Patient Active Problem List   Diagnosis Date Noted   Invasive ductal carcinoma of right breast (HCC) 02/22/2024   Acute non-recurrent pansinusitis 12/17/2020   History of shingles 12/17/2020   Skin sensitivity 12/17/2020   History of COVID-19 12/17/2020   Heat intolerance 11/03/2020   Increased risk of breast cancer    Diffuse nontoxic goiter 06/07/2018   Family history of breast cancer 01/12/2018   Abnormal blood chemistry 04/20/2016   Abnormal liver enzymes 04/20/2016   Interstitial cystitis 04/20/2016   Ganglion cyst of wrist 04/20/2016   Occipital mass 04/20/2016   Herpes zoster without complication 07/08/2009    Past Surgical History:  Procedure Laterality Date   BREAST BIOPSY Right 02/15/2024   US  Bx, ribbon clip,INVASIVE MAMMARY CARCINOMA, NO SPECIAL TYPE. - OVERALL GRADE: 1. - LYMPHOVASCULAR INVASION: NOT IDENTIFIED. - CANCER LENGTH: 9 MM - CALCIFICATIONS: NOT IDENTIFIED. - DUCTAL CARCINOMA IN SITU: PRESENT, INTERMEDIATE GRADE.   BREAST BIOPSY Right 02/15/2024   US  RT BREAST BX W LOC DEV 1ST LESION IMG BX SPEC US  GUIDE 02/15/2024 [facility]   BREAST LUMPECTOMY Right 02/27/2024   Procedure:  BREAST LUMPECTOMY;  Surgeon: Eldred Grego, MD;  Location: [facility];  Service: General;  Laterality: Right;  w/ RF tag   COLPOSCOPY  1999   LGSIL Pap, mild dysplasia, VAIN 1   DILATION AND CURETTAGE OF UTERUS  2005   due to missed abortion   HYSTEROSCOPY WITH D & C N/A 06/27/2015   Procedure: DILATATION AND CURETTAGE /HYSTEROSCOPY;  Surgeon: Margarie Shay Ward, MD;  Location: [facility];  Service: Gynecology;  Laterality: N/A;   savi tag placement Right 02/23/2024   SENTINEL NODE BIOPSY Right 02/27/2024   Procedure: BIOPSY, LYMPH NODE, SENTINEL;  Surgeon: Eldred Grego, MD;  Location: [facility];  Service: General;  Laterality: Right;   WISDOM TOOTH EXTRACTION      Family History  Problem Relation Age of Onset   Hypertension Mother    Breast cancer Mother 67   Crohn's disease Father    Scoliosis Sister    Crohn's disease Paternal Grandmother    Breast cancer Paternal Grandmother 66   CAD Paternal Grandfather        stent   Breast cancer Cousin 31       2nd cousin paternal side. died in 04/24/2003   Colon cancer Paternal Uncle 76   Breast cancer Cousin 11       2nd cousin on paternal side   Hypertension Maternal Aunt    Multiple myeloma Maternal Aunt     Social History   Socioeconomic History   Marital  status: Married    Spouse name: Johnathan   Number of children: 2   Years of education: Not on file   Highest education level: Not on file  Occupational History   Occupation: Journalist, newspaper  Tobacco Use   Smoking status: Never   Smokeless tobacco: Never  Vaping Use   Vaping status: Never Used  Substance and Sexual Activity   Alcohol use: No   Drug use: No   Sexual activity: Yes    Partners: Male    Birth control/protection: Surgical    Comment: vasectomy  Other Topics Concern   Not on file  Social History Narrative   Not on file   Social Drivers of Health   Financial Resource Strain: Low Risk  (02/21/2024)   Received from [facility]    Overall Financial Resource Strain (CARDIA)    Difficulty of Paying Living Expenses: Not hard at all  Food Insecurity: No Food Insecurity (02/21/2024)   Received from [facility]   Hunger Vital Sign    Worried About Running Out of Food in the Last Year: Never true    Ran Out of Food in the Last Year: Never true  Transportation Needs: No Transportation Needs (02/21/2024)   Received from [facility] - Transportation    In the past 12 months, has lack of transportation kept you from medical appointments or from getting medications?: No    Lack of Transportation (Non-Medical): No  Physical Activity: Not on file  Stress: Not on file  Social Connections: Not on file  Intimate Partner Violence: Not on file    Outpatient Medications Prior to Visit  Medication Sig Dispense Refill   ibuprofen  (ADVIL ) 200 MG tablet Take 400 mg by mouth every 8 (eight) hours as needed (headaches.).     levocetirizine (XYZAL) 5 MG tablet Take 5 mg by mouth every evening.     Multiple Vitamins-Minerals (MULTIVITAMIN WITH MINERALS) tablet Take 1 tablet by mouth in the morning.     No facility-administered medications prior to visit.      ROS:  Review of Systems  Constitutional:  Negative for fever.  Gastrointestinal:  Negative for blood in stool, constipation, diarrhea, nausea and vomiting.  Genitourinary:  Negative for dyspareunia, dysuria, flank pain, frequency, hematuria, urgency, vaginal bleeding, vaginal discharge and vaginal pain.  Musculoskeletal:  Negative for back pain.  Skin:  Negative for rash.   BREAST: No symptoms   OBJECTIVE:   Vitals:  BP (!) 143/98   Pulse 87   Ht 5\' 7"  (1.702 m)   Wt 200 lb (90.7 kg)   LMP 03/09/2024 (Exact Date)   BMI 31.32 kg/m  Pt BP elevated due pt being nervous about follow up   Physical Exam Genitourinary:    Labia:        Right: Lesion present.        Left: Lesion present.       VULVAR BIOPSY NOTE The  indications for vulvar biopsy (rule out neoplasia, establish lichen sclerosus diagnosis) were reviewed.   Risks of the biopsy including pain, bleeding, infection. The patient stated understanding and agreed to undergo procedure today. Consent was signed,  time out performed.   The patient's vulva was prepped with Betadine. 1% lidocaine  was injected into area of concern. A 3 -mm punch biopsy was done, biopsy tissue was picked up with sterile forceps and sterile scissors were used to excise the lesion.  Small bleeding was noted and hemostasis was  achieved using silver nitrate sticks.  The patient tolerated the procedure well. Post-procedure instructions  (pelvic rest for one week) were given to the patient. The patient is to call with heavy bleeding, fever greater than 100.4, foul smelling vaginal discharge or other concerns.    SKIN TAG REMOVAL  Pt would like skin tag removed. Risk of infection and bleeding discussed.  The patient's vulva was prepped with betadine. 1% lidocaine  was injected into area of concern. The skin tag  was picked up with sterile forceps and sterile scissors were used to excise the lesion.  Small bleeding was noted and hemostasis was achieved using silver nitrate sticks and 2 stitches.  The patient tolerated the procedure well. Post-procedure wound care instructions were given and infection signs discussed.    Pt told to keep area dry for the evening and blot with wiping. RTO if any bleeding restarted that couldn't be stopped.   Assessment/Plan: Labial lesion - Plan: Surgical pathology; bx sent to pathology, will f/u with results.   Skin tag - Plan: Surgical pathology; removed and sent to pathology. Hemostasis not achieved with pressure/silver nitrate so Dr. Luster Salters put in 2 stitches.     Return if symptoms worsen or fail to improve.  Jamie-Lee Galdamez B. Tyrhonda Georgiades, PA-C 03/21/2024 9:32 PM

## 2024-03-21 ENCOUNTER — Ambulatory Visit
Admission: RE | Admit: 2024-03-21 | Discharge: 2024-03-21 | Disposition: A | Discharge status: Home / Self Care | Source: Ambulatory Visit | Attending: Radiation Oncology | Admitting: Radiation Oncology

## 2024-03-21 ENCOUNTER — Encounter: Payer: Self-pay | Admitting: Obstetrics and Gynecology

## 2024-03-21 DIAGNOSIS — Z510 Encounter for antineoplastic radiation therapy: Secondary | ICD-10-CM | POA: Diagnosis not present

## 2024-03-22 ENCOUNTER — Other Ambulatory Visit: Payer: Self-pay | Admitting: *Deleted

## 2024-03-22 DIAGNOSIS — Z170 Estrogen receptor positive status [ER+]: Secondary | ICD-10-CM | POA: Insufficient documentation

## 2024-03-22 DIAGNOSIS — Z803 Family history of malignant neoplasm of breast: Secondary | ICD-10-CM | POA: Insufficient documentation

## 2024-03-22 DIAGNOSIS — Z510 Encounter for antineoplastic radiation therapy: Secondary | ICD-10-CM | POA: Diagnosis not present

## 2024-03-22 DIAGNOSIS — D071 Carcinoma in situ of vulva: Secondary | ICD-10-CM | POA: Diagnosis not present

## 2024-03-22 DIAGNOSIS — K219 Gastro-esophageal reflux disease without esophagitis: Secondary | ICD-10-CM | POA: Diagnosis not present

## 2024-03-22 DIAGNOSIS — E049 Nontoxic goiter, unspecified: Secondary | ICD-10-CM | POA: Insufficient documentation

## 2024-03-22 DIAGNOSIS — Z800 Family history of malignant neoplasm of digestive organs: Secondary | ICD-10-CM | POA: Diagnosis not present

## 2024-03-22 DIAGNOSIS — C50411 Malignant neoplasm of upper-outer quadrant of right female breast: Secondary | ICD-10-CM | POA: Insufficient documentation

## 2024-03-22 DIAGNOSIS — Z807 Family history of other malignant neoplasms of lymphoid, hematopoietic and related tissues: Secondary | ICD-10-CM | POA: Diagnosis not present

## 2024-03-22 DIAGNOSIS — Z79899 Other long term (current) drug therapy: Secondary | ICD-10-CM | POA: Insufficient documentation

## 2024-03-22 DIAGNOSIS — C50911 Malignant neoplasm of unspecified site of right female breast: Secondary | ICD-10-CM

## 2024-03-23 LAB — SURGICAL PATHOLOGY

## 2024-03-25 ENCOUNTER — Encounter: Payer: Self-pay | Admitting: Obstetrics and Gynecology

## 2024-03-25 NOTE — Addendum Note (Signed)
 Addended by: Alyn Judge B on: 03/25/2024 09:06 PM   Modules accepted: Orders

## 2024-03-28 ENCOUNTER — Ambulatory Visit
Admission: RE | Admit: 2024-03-28 | Discharge: 2024-03-28 | Disposition: A | Discharge status: Home / Self Care | Source: Ambulatory Visit | Attending: Radiation Oncology | Admitting: Radiation Oncology

## 2024-03-28 DIAGNOSIS — C50411 Malignant neoplasm of upper-outer quadrant of right female breast: Secondary | ICD-10-CM | POA: Diagnosis not present

## 2024-03-29 ENCOUNTER — Other Ambulatory Visit: Payer: Self-pay

## 2024-03-29 ENCOUNTER — Ambulatory Visit
Admission: RE | Admit: 2024-03-29 | Discharge: 2024-03-29 | Disposition: A | Discharge status: Home / Self Care | Source: Ambulatory Visit | Attending: Radiation Oncology | Admitting: Radiation Oncology

## 2024-03-29 DIAGNOSIS — C50411 Malignant neoplasm of upper-outer quadrant of right female breast: Secondary | ICD-10-CM | POA: Diagnosis not present

## 2024-03-29 LAB — RAD ONC ARIA SESSION SUMMARY
Course Elapsed Days: 0
Plan Fractions Treated to Date: 1
Plan Prescribed Dose Per Fraction: 2.66 Gy
Plan Total Fractions Prescribed: 16
Plan Total Prescribed Dose: 42.56 Gy
Reference Point Dosage Given to Date: 2.66 Gy
Reference Point Session Dosage Given: 2.66 Gy
Session Number: 1

## 2024-03-30 ENCOUNTER — Other Ambulatory Visit: Payer: Self-pay

## 2024-03-30 ENCOUNTER — Ambulatory Visit
Admission: RE | Admit: 2024-03-30 | Discharge: 2024-03-30 | Disposition: A | Discharge status: Home / Self Care | Source: Ambulatory Visit | Attending: Radiation Oncology | Admitting: Radiation Oncology

## 2024-03-30 DIAGNOSIS — C50411 Malignant neoplasm of upper-outer quadrant of right female breast: Secondary | ICD-10-CM | POA: Diagnosis not present

## 2024-03-30 LAB — RAD ONC ARIA SESSION SUMMARY
Course Elapsed Days: 1
Plan Fractions Treated to Date: 2
Plan Prescribed Dose Per Fraction: 2.66 Gy
Plan Total Fractions Prescribed: 16
Plan Total Prescribed Dose: 42.56 Gy
Reference Point Dosage Given to Date: 5.32 Gy
Reference Point Session Dosage Given: 2.66 Gy
Session Number: 2

## 2024-04-02 ENCOUNTER — Other Ambulatory Visit: Payer: Self-pay

## 2024-04-02 ENCOUNTER — Ambulatory Visit
Admission: RE | Admit: 2024-04-02 | Discharge: 2024-04-02 | Disposition: A | Discharge status: Home / Self Care | Source: Ambulatory Visit | Attending: Radiation Oncology | Admitting: Radiation Oncology

## 2024-04-02 DIAGNOSIS — C50411 Malignant neoplasm of upper-outer quadrant of right female breast: Secondary | ICD-10-CM | POA: Diagnosis not present

## 2024-04-02 LAB — RAD ONC ARIA SESSION SUMMARY
Course Elapsed Days: 4
Plan Fractions Treated to Date: 3
Plan Prescribed Dose Per Fraction: 2.66 Gy
Plan Total Fractions Prescribed: 16
Plan Total Prescribed Dose: 42.56 Gy
Reference Point Dosage Given to Date: 7.98 Gy
Reference Point Session Dosage Given: 2.66 Gy
Session Number: 3

## 2024-04-03 ENCOUNTER — Other Ambulatory Visit: Payer: Self-pay

## 2024-04-03 ENCOUNTER — Ambulatory Visit
Admission: RE | Admit: 2024-04-03 | Discharge: 2024-04-03 | Disposition: A | Discharge status: Home / Self Care | Source: Ambulatory Visit | Attending: Radiation Oncology | Admitting: Radiation Oncology

## 2024-04-03 DIAGNOSIS — C50411 Malignant neoplasm of upper-outer quadrant of right female breast: Secondary | ICD-10-CM | POA: Diagnosis not present

## 2024-04-03 LAB — RAD ONC ARIA SESSION SUMMARY
Course Elapsed Days: 5
Plan Fractions Treated to Date: 4
Plan Prescribed Dose Per Fraction: 2.66 Gy
Plan Total Fractions Prescribed: 16
Plan Total Prescribed Dose: 42.56 Gy
Reference Point Dosage Given to Date: 10.64 Gy
Reference Point Session Dosage Given: 2.66 Gy
Session Number: 4

## 2024-04-04 ENCOUNTER — Ambulatory Visit
Admission: RE | Admit: 2024-04-04 | Discharge: 2024-04-04 | Disposition: A | Discharge status: Home / Self Care | Source: Ambulatory Visit | Attending: Radiation Oncology | Admitting: Radiation Oncology

## 2024-04-04 ENCOUNTER — Inpatient Hospital Stay

## 2024-04-04 ENCOUNTER — Other Ambulatory Visit: Payer: Self-pay

## 2024-04-04 DIAGNOSIS — D071 Carcinoma in situ of vulva: Secondary | ICD-10-CM | POA: Insufficient documentation

## 2024-04-04 DIAGNOSIS — Z803 Family history of malignant neoplasm of breast: Secondary | ICD-10-CM | POA: Insufficient documentation

## 2024-04-04 DIAGNOSIS — Z79899 Other long term (current) drug therapy: Secondary | ICD-10-CM | POA: Insufficient documentation

## 2024-04-04 DIAGNOSIS — E049 Nontoxic goiter, unspecified: Secondary | ICD-10-CM | POA: Insufficient documentation

## 2024-04-04 DIAGNOSIS — K219 Gastro-esophageal reflux disease without esophagitis: Secondary | ICD-10-CM | POA: Insufficient documentation

## 2024-04-04 DIAGNOSIS — Z807 Family history of other malignant neoplasms of lymphoid, hematopoietic and related tissues: Secondary | ICD-10-CM | POA: Insufficient documentation

## 2024-04-04 DIAGNOSIS — C50911 Malignant neoplasm of unspecified site of right female breast: Secondary | ICD-10-CM

## 2024-04-04 DIAGNOSIS — Z800 Family history of malignant neoplasm of digestive organs: Secondary | ICD-10-CM | POA: Insufficient documentation

## 2024-04-04 DIAGNOSIS — C50411 Malignant neoplasm of upper-outer quadrant of right female breast: Secondary | ICD-10-CM | POA: Insufficient documentation

## 2024-04-04 LAB — CBC (CANCER CENTER ONLY)
HCT: 42.3 % (ref 36.0–46.0)
Hemoglobin: 14.3 g/dL (ref 12.0–15.0)
MCH: 31.2 pg (ref 26.0–34.0)
MCHC: 33.8 g/dL (ref 30.0–36.0)
MCV: 92.4 fL (ref 80.0–100.0)
Platelet Count: 314 10*3/uL (ref 150–400)
RBC: 4.58 MIL/uL (ref 3.87–5.11)
RDW: 11.6 % (ref 11.5–15.5)
WBC Count: 4.3 10*3/uL (ref 4.0–10.5)
nRBC: 0 % (ref 0.0–0.2)

## 2024-04-04 LAB — RAD ONC ARIA SESSION SUMMARY
Course Elapsed Days: 6
Plan Fractions Treated to Date: 5
Plan Prescribed Dose Per Fraction: 2.66 Gy
Plan Total Fractions Prescribed: 16
Plan Total Prescribed Dose: 42.56 Gy
Reference Point Dosage Given to Date: 13.3 Gy
Reference Point Session Dosage Given: 2.66 Gy
Session Number: 5

## 2024-04-05 ENCOUNTER — Ambulatory Visit
Admission: RE | Admit: 2024-04-05 | Discharge: 2024-04-05 | Disposition: A | Discharge status: Home / Self Care | Source: Ambulatory Visit | Attending: Radiation Oncology | Admitting: Radiation Oncology

## 2024-04-05 ENCOUNTER — Other Ambulatory Visit: Payer: Self-pay

## 2024-04-05 DIAGNOSIS — C50411 Malignant neoplasm of upper-outer quadrant of right female breast: Secondary | ICD-10-CM | POA: Diagnosis not present

## 2024-04-05 LAB — RAD ONC ARIA SESSION SUMMARY
Course Elapsed Days: 7
Plan Fractions Treated to Date: 6
Plan Prescribed Dose Per Fraction: 2.66 Gy
Plan Total Fractions Prescribed: 16
Plan Total Prescribed Dose: 42.56 Gy
Reference Point Dosage Given to Date: 15.96 Gy
Reference Point Session Dosage Given: 2.66 Gy
Session Number: 6

## 2024-04-06 ENCOUNTER — Other Ambulatory Visit: Payer: Self-pay

## 2024-04-06 ENCOUNTER — Ambulatory Visit
Admission: RE | Admit: 2024-04-06 | Discharge: 2024-04-06 | Disposition: A | Discharge status: Home / Self Care | Source: Ambulatory Visit | Attending: Radiation Oncology | Admitting: Radiation Oncology

## 2024-04-06 DIAGNOSIS — C50411 Malignant neoplasm of upper-outer quadrant of right female breast: Secondary | ICD-10-CM | POA: Diagnosis not present

## 2024-04-06 LAB — RAD ONC ARIA SESSION SUMMARY
Course Elapsed Days: 8
Plan Fractions Treated to Date: 7
Plan Prescribed Dose Per Fraction: 2.66 Gy
Plan Total Fractions Prescribed: 16
Plan Total Prescribed Dose: 42.56 Gy
Reference Point Dosage Given to Date: 18.62 Gy
Reference Point Session Dosage Given: 2.66 Gy
Session Number: 7

## 2024-04-09 ENCOUNTER — Ambulatory Visit
Admission: RE | Admit: 2024-04-09 | Discharge: 2024-04-09 | Disposition: A | Discharge status: Home / Self Care | Source: Ambulatory Visit | Attending: Radiation Oncology | Admitting: Radiation Oncology

## 2024-04-09 ENCOUNTER — Other Ambulatory Visit: Payer: Self-pay

## 2024-04-09 DIAGNOSIS — C50411 Malignant neoplasm of upper-outer quadrant of right female breast: Secondary | ICD-10-CM | POA: Diagnosis not present

## 2024-04-09 LAB — RAD ONC ARIA SESSION SUMMARY
Course Elapsed Days: 11
Plan Fractions Treated to Date: 8
Plan Prescribed Dose Per Fraction: 2.66 Gy
Plan Total Fractions Prescribed: 16
Plan Total Prescribed Dose: 42.56 Gy
Reference Point Dosage Given to Date: 21.28 Gy
Reference Point Session Dosage Given: 2.66 Gy
Session Number: 8

## 2024-04-10 ENCOUNTER — Ambulatory Visit
Admission: RE | Admit: 2024-04-10 | Discharge: 2024-04-10 | Disposition: A | Discharge status: Home / Self Care | Source: Ambulatory Visit | Attending: Radiation Oncology | Admitting: Radiation Oncology

## 2024-04-10 ENCOUNTER — Other Ambulatory Visit: Payer: Self-pay

## 2024-04-10 DIAGNOSIS — C50411 Malignant neoplasm of upper-outer quadrant of right female breast: Secondary | ICD-10-CM | POA: Diagnosis not present

## 2024-04-10 LAB — RAD ONC ARIA SESSION SUMMARY
Course Elapsed Days: 12
Plan Fractions Treated to Date: 9
Plan Prescribed Dose Per Fraction: 2.66 Gy
Plan Total Fractions Prescribed: 16
Plan Total Prescribed Dose: 42.56 Gy
Reference Point Dosage Given to Date: 23.94 Gy
Reference Point Session Dosage Given: 2.66 Gy
Session Number: 9

## 2024-04-11 ENCOUNTER — Encounter: Payer: Self-pay | Admitting: Obstetrics and Gynecology

## 2024-04-11 ENCOUNTER — Other Ambulatory Visit: Payer: Self-pay

## 2024-04-11 ENCOUNTER — Inpatient Hospital Stay

## 2024-04-11 ENCOUNTER — Inpatient Hospital Stay (HOSPITAL_BASED_OUTPATIENT_CLINIC_OR_DEPARTMENT_OTHER): Admitting: Obstetrics and Gynecology

## 2024-04-11 ENCOUNTER — Ambulatory Visit
Admission: RE | Admit: 2024-04-11 | Discharge: 2024-04-11 | Disposition: A | Discharge status: Home / Self Care | Source: Ambulatory Visit | Attending: Radiation Oncology | Admitting: Radiation Oncology

## 2024-04-11 ENCOUNTER — Other Ambulatory Visit

## 2024-04-11 VITALS — BP 128/84 | HR 105 | Temp 98.6°F | Resp 20 | Wt 195.6 lb

## 2024-04-11 DIAGNOSIS — C50411 Malignant neoplasm of upper-outer quadrant of right female breast: Secondary | ICD-10-CM | POA: Diagnosis not present

## 2024-04-11 DIAGNOSIS — D071 Carcinoma in situ of vulva: Secondary | ICD-10-CM | POA: Diagnosis not present

## 2024-04-11 LAB — RAD ONC ARIA SESSION SUMMARY
Course Elapsed Days: 13
Plan Fractions Treated to Date: 10
Plan Prescribed Dose Per Fraction: 2.66 Gy
Plan Total Fractions Prescribed: 16
Plan Total Prescribed Dose: 42.56 Gy
Reference Point Dosage Given to Date: 26.6 Gy
Reference Point Session Dosage Given: 2.66 Gy
Session Number: 10

## 2024-04-11 NOTE — Progress Notes (Signed)
 Gynecologic Oncology Consult Visit   Referring Provider: [facility] [address] 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  She has a h/o HPV--VAIN 1/LGSIL 1999 on colpo bx with Dr. Lucille Saas and recently diagnosed breast cancer currently on radiation.    She was noted to have a RT labial lesion treated with TCA 3/25. Area was pale/white and looked almost warty; turned white with TCA tx.   Vulvar biopsy was obtained.   03/20/2024 A. 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   Note: The skin shows full-thickness atypia with near surface mitoses but  with focally preserved granular layer and evidence of hyperkeratosis.  P53 is wild-type p16 is overall positive however not fully strong  blocklike positive.  The proliferation rate by Ki-67 shows  full-thickness positivity.  The findings are consistent with high-grade  VIN and most consistent with usual/HPV associated VIN.   B. LABIA, LEFT LABIA MINORA, SKIN TAG:  -  Fibroepithelial polyp, negative for dysplasia.   Problem List: Patient Active Problem List   Diagnosis Date Noted   Invasive ductal carcinoma of right breast (HCC) 02/22/2024   Acute non-recurrent pansinusitis 12/17/2020   History of shingles 12/17/2020   Skin sensitivity 12/17/2020   History of COVID-19 12/17/2020   Heat intolerance 11/03/2020   Increased risk of breast cancer    Diffuse nontoxic goiter 06/07/2018   Family history of breast cancer 01/12/2018   Abnormal blood chemistry 04/20/2016   Abnormal liver enzymes 04/20/2016   Interstitial cystitis 04/20/2016   Ganglion cyst of wrist 04/20/2016   Occipital mass 04/20/2016   Herpes zoster without complication 07/08/2009    Past Medical History: Past  Medical History:  Diagnosis Date   Abnormal Pap smear of cervix 1999   LGSIL   BRCA negative 2013; 2/19   BRCA neg; MyRisk neg   Cancer (HCC)    rt breast   Complication of anesthesia    woke up during wisdom teeth surgery   Family history of breast cancer    has tested negative for HBOC in 2013/ negative Myrisk test 2019/ lifetime risk 24.6%   GERD (gastroesophageal reflux disease)    Goiter    Headache    cluster HA   History of chicken pox    Increased risk of breast cancer    IBIS=25%, riskscore=24.6%   Interstitial cystitis    Screening for colon cancer 10/2020   Cologuard NEG; repeat after 3 yrs   Shingles     Past Surgical History: Past Surgical History:  Procedure Laterality Date   BREAST BIOPSY Right 02/15/2024   US  Bx, ribbon clip,INVASIVE MAMMARY CARCINOMA, NO SPECIAL TYPE. - OVERALL GRADE: 1. - LYMPHOVASCULAR INVASION: NOT IDENTIFIED. - CANCER LENGTH: 9 MM - CALCIFICATIONS: NOT IDENTIFIED. - DUCTAL CARCINOMA IN SITU: PRESENT, INTERMEDIATE GRADE.   BREAST BIOPSY Right 02/15/2024   US  RT BREAST BX W LOC DEV 1ST LESION IMG BX SPEC US  GUIDE 02/15/2024 [facility]   BREAST LUMPECTOMY Right 02/27/2024   Procedure: BREAST LUMPECTOMY;  Surgeon: Eldred Grego, MD;  Location: [facility];  Service: General;  Laterality: Right;  w/ RF tag   COLPOSCOPY  1999   LGSIL Pap, mild dysplasia, VAIN 1   DILATION AND CURETTAGE OF UTERUS  2005   due  to missed abortion   HYSTEROSCOPY WITH D & C N/A 06/27/2015   Procedure: DILATATION AND CURETTAGE /HYSTEROSCOPY;  Surgeon: Margarie Shay Ward, MD;  Location: [facility];  Service: Gynecology;  Laterality: N/A;   savi tag placement Right 02/23/2024   SENTINEL NODE BIOPSY Right 02/27/2024   Procedure: BIOPSY, LYMPH NODE, SENTINEL;  Surgeon: Eldred Grego, MD;  Location: [facility];  Service: General;  Laterality: Right;   WISDOM TOOTH EXTRACTION      Past Gynecologic History:  Menarche: unknown Menstrual details: she recently  stopped OCPs due to breast cancer diagnosis.  Menses regular: yes - only two since stopped OCPs Last Menstrual Period: 04/03/2024 History of Abnormal pap: yes, low-grade squamous intraepithelial neoplasia (LGSIL - encompassing HPV,mild dysplasia,CIN I) 1999 Last pap: 11/19/2021 NIML/HRHPV negative Contraception: vasectomy Sexually active: yes  OB History:  OB History  Gravida Para Term Preterm AB Living  3 2 2  1 2   SAB IAB Ectopic Multiple Live Births  1    2    # Outcome Date GA Lbr Len/2nd Weight Sex Type Anes PTL Lv  3 Term 05/15/05 [redacted]w[redacted]d  10 lb 11 oz (4.848 kg) M Vag-Spont   LIV  2 Term 02/06/03 [redacted]w[redacted]d  8 lb 4 oz (3.742 kg) F Vag-Vacuum EPI  LIV     Birth Comments: VAD for terminal bradycardia  1 SAB             Family History: Family History  Problem Relation Age of Onset   Hypertension Mother    Breast cancer Mother 63   Crohn's disease Father    Scoliosis Sister    Crohn's disease Paternal Grandmother    Breast cancer Paternal Grandmother 82   CAD Paternal Grandfather        stent   Breast cancer Cousin 54       2nd cousin paternal side. died in 05-05-03   Colon cancer Paternal Uncle 44   Breast cancer Cousin 74       2nd cousin on paternal side   Hypertension Maternal Aunt    Multiple myeloma Maternal Aunt     Social History: Social History   Socioeconomic History   Marital status: Married    Spouse name: Johnathan   Number of children: 2   Years of education: Not on file   Highest education level: Not on file  Occupational History   Occupation: Journalist, newspaper  Tobacco Use   Smoking status: Never   Smokeless tobacco: Never  Vaping Use   Vaping status: Never Used  Substance and Sexual Activity   Alcohol use: No   Drug use: No   Sexual activity: Yes    Partners: Male    Birth control/protection: Surgical    Comment: vasectomy  Other Topics Concern   Not on file  Social History Narrative   Not on file   Social Drivers of Health   Financial  Resource Strain: Low Risk  (02/21/2024)   Received from [facility]   Overall Financial Resource Strain (CARDIA)    Difficulty of Paying Living Expenses: Not hard at all  Food Insecurity: No Food Insecurity (02/21/2024)   Received from [facility]   Hunger Vital Sign    Worried About Running Out of Food in the Last Year: Never true    Ran Out of Food in the Last Year: Never true  Transportation Needs: No Transportation Needs (02/21/2024)   Received from [facility]   [facility] - Transportation  In the past 12 months, has lack of transportation kept you from medical appointments or from getting medications?: No    Lack of Transportation (Non-Medical): No  Physical Activity: Not on file  Stress: Not on file  Social Connections: Not on file  Intimate Partner Violence: Not on file    Allergies: No Known Allergies  Current Medications: Current Outpatient Medications  Medication Sig Dispense Refill   ibuprofen  (ADVIL ) 200 MG tablet Take 400 mg by mouth every 8 (eight) hours as needed (headaches.).     levocetirizine (XYZAL) 5 MG tablet Take 5 mg by mouth every evening.     Multiple Vitamins-Minerals (MULTIVITAMIN WITH MINERALS) tablet Take 1 tablet by mouth in the morning.     No current facility-administered medications for this visit.    Review of Systems  General: negative for fevers, changes in weight or night sweats Skin: negative for changes in moles or sores or rash Eyes: negative for changes in vision HEENT: negative for change in hearing, tinnitus, voice changes Pulmonary: negative for dyspnea, orthopnea, productive cough, wheezing Cardiac: negative for palpitations, pain Gastrointestinal: negative for nausea, vomiting, constipation, diarrhea, hematemesis, hematochezia Genitourinary/Sexual: negative for dysuria, retention, hematuria, incontinence Ob/Gyn:  negative for abnormal bleeding, or pain Musculoskeletal: negative for  pain, joint pain, back pain Hematology: negative for easy bruising, abnormal bleeding Neurologic/Psych: negative for headaches, seizures, paralysis, weakness, numbness   Objective:  Physical Examination:  BP 128/84   Pulse (!) 105   Temp 98.6 F (37 C)   Resp 20   Wt 195 lb 9.6 oz (88.7 kg)   LMP 03/09/2024 (Exact Date)   SpO2 100%   BMI 30.64 kg/m    ECOG Performance Status: 0 - Asymptomatic   GENERAL: Patient is a well appearing female in no acute distress HEENT:  PERRL, neck supple with midline trachea.  NODES:  No cervical, supraclavicular, axillary, or inguinal lymphadenopathy palpated.  LUNGS:  Clear to auscultation bilaterally.  No wheezes or rhonchi. HEART:  Regular rate and rhythm.  ABDOMEN:  Soft, nontender, nondistended. No ascites or masses EXTREMITIES:  No peripheral edema.   SKIN:  Clear with no obvious rashes or skin changes. No nail dyscrasia. NEURO:  Nonfocal. Well oriented.  Appropriate affect.    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 Entire lesion ~ 2 cm.  Vagina: normal without discharge Cervix: normal cervix without lesions, polyps or tenderness, pap smear done today Uterus: normal size, shape, consistency, no mass or tenderness BME: adnexa normal in size without mass or tenderness Anal: no external abnormal lesions noted    Lab Review Lab Results  Component Value Date   WBC 4.3 04/04/2024   HGB 14.3 04/04/2024   HCT 42.3 04/04/2024   MCV 92.4 04/04/2024   PLT 314 04/04/2024   Lab Results  Component Value Date   NA 140 02/10/2024   CL 105 02/10/2024   K 4.0 02/10/2024   CO2 22 02/10/2024   BUN 13 02/10/2024   CREATININE 0.74 02/10/2024   EGFR 100 02/10/2024   CALCIUM 9.2 02/10/2024   ALBUMIN 4.1 02/10/2024   GLUCOSE 90 02/10/2024   Lab Results  Component Value Date   HGBA1C 5.6 02/10/2024     Radiologic  Imaging: 04/24/2021 CLINICAL DATA:  RIGHT lower quadrant abdominal pain for 1 week, irregular menses for months, LMP 04/05/2021   EXAM: TRANSABDOMINAL AND TRANSVAGINAL ULTRASOUND OF PELVIS   TECHNIQUE:  Both transabdominal and transvaginal ultrasound examinations of the pelvis were performed. Transabdominal technique was performed for global imaging of the pelvis including uterus, ovaries, adnexal regions, and pelvic cul-de-sac. It was necessary to proceed with endovaginal exam following the transabdominal exam to visualize the uterus, endometrium, and ovaries.   COMPARISON:  11/30/2013   FINDINGS: Uterus   Measurements: 8.3 x 4.4 x 5.4 cm = volume: 105 mL. Anteverted. Nabothian cysts at cervix. Subserosal leiomyoma at posterior fundus 2.2 x 1.9 x 3.1 cm. No additional masses.   Endometrium   Thickness: 6 mm.  No endometrial fluid or focal abnormality   Right ovary   Not visualized, likely obscured by bowel   Left ovary   Measurements: 2.3 x 1.4 x 1.7 cm = volume: 2.8 mL. Small exophytic follicle; no follow-up imaging recommended. No additional mass.   Other findings   No free pelvic fluid or adnexal masses.   IMPRESSION: Subserosal leiomyoma at posterior uterine fundus 3.1 cm greatest size.   Nonvisualization of RIGHT ovary.   Remainder of exam normal.    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   Medical co-morbidities complicating care: recent breast cancer diagnosis Plan:   Problem List Items Addressed This Visit   None   We discussed options for management including surgery with WLE vs ablation and topical therapies such as imiquimod. Based on VIN3 result, we recommend definitive surgical evaluation with vulvar colposcopy, WLE, and vulvar biopsies to be determine based on colposcopic findings.  Risks were discussed in detail. These include infection, bleeding, wound separation, recurrent/persistent VIN. Anesthesia will  review risks of anesthesia related issues.   Suggested return to clinic in  4-6 weeks.    The patient's diagnosis, an outline of the further diagnostic and laboratory studies which will be required, the recommendation, and alternatives were discussed.  All questions were answered to the patient's satisfaction.  A total of 60 minutes were spent with the patient/family today; >50% was spent in education, counseling and coordination of care for dysplasia.    Rayn Enderson Iola Manila, MD    CC:  [facility] [address] [place],  [place] [address] [phone]

## 2024-04-11 NOTE — Patient Instructions (Addendum)
 Your surgery will be scheduled May 02, 2024 You will have a pre-admission telephone visit prior to surgery. This visit will take around 40-45 minutes. Gyn oncology will see you 4-6 weeks following surgery for a post operative visit.     Vulvectomy, Wide Local Excision, or Laser Ablation: Surgical Information  The vulva is the external female genitalia, outside and around the vagina and pubic bone. It consists of:  The skin on, and in front of, the pubic bone.  The clitoris.  The labia majora (large lips) on the outside of the vagina.  The labia minora (small lips) around the opening of the vagina.  The opening and the skin in and around the vagina.   A vulvectomy or wide local excision of the vulva is the removal of the tissue of the vulva, which sometimes includes removal of the lymph nodes and tissue in the groin areas.  After surgery, a doctor will look at the tissue under a microscope. He or she will check it for abnormal cells.    Laser ablation is a procedure used to treat precancerous skin changes. The top layers of skin are removed with the laser. The wound is then covered with ointment. A scab will form over the area. The wound may take 3 to 6 week s to heal, depending on the size of the area treated. Good wound care may help the scar fade with time.  Most women go home 1 to 4 hours after surgery. You may need to stay overnight if you had lymph nodes removed. You will probably be able to return to your normal routine in 1 or 2 weeks.    Incision Care after surgery Do not douche or use tampons  Do not have sexual intercourse until your caregiver gives you permission.  After moving your bowels or urinating use a bottle filled with warm water  (peri-bottle) to clean the area. Gently wipe from front to back or pat the skin dry. You may use a blow dryer on the cool setting to help dry the skin.  Wear loose cotton underwear. Do not wear tight pants.  A sitz bath will help keep your  perineal area clean, reduce swelling, and provide comfort.  You may notice that your urine stream is at a different angle, and may spray. Using a plastic funnel may help decrease spray  Avoid exercises, such as running or biking for 3 weeks or as directed.  If your doctor has removed lymph nodes from your groin area, there may be an increase in swelling of your legs and feet. You can help prevent swelling by doing the following:    Elevate your legs while sitting or lying down.    Avoid standing in one place for long periods of time.    Avoid salt in your diet. It can cause fluid retention and swelling.    Do not cross your legs, especially when sitting.   Follow-up care is a key part of your treatment and safety. Be sure to make and go to all appointments, and call your doctor if you are having problems. It's also a good idea to know your test results and keep a list of the me dicines you take.

## 2024-04-11 NOTE — Progress Notes (Signed)
 Met with patient to review pre-operative teaching for planned surgery scheduled for May 02, 2024. Pre-admit phone screen appointment discussed. Teaching included but not limited to labs, bowel prep and dietary restrictions, surgical location,  diligent peri care, and importance of early ambulation post operatively and follow-up care. Written instructions for pre-op and post-op care, contact information for questions and concerns provided to patient. Patient able to repeat instructions to nursing staff. No further questions at this time. Patient agrees with plan to proceed with scheduled procedure.

## 2024-04-11 NOTE — H&P (View-Only) (Signed)
 Gynecologic Oncology Consult Visit   Referring Provider: [facility] [address] 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  She has a h/o HPV--VAIN 1/LGSIL 1999 on colpo bx with Dr. Lucille Saas and recently diagnosed breast cancer currently on radiation.    She was noted to have a RT labial lesion treated with TCA 3/25. Area was pale/white and looked almost warty; turned white with TCA tx.   Vulvar biopsy was obtained.   03/20/2024 A. 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   Note: The skin shows full-thickness atypia with near surface mitoses but  with focally preserved granular layer and evidence of hyperkeratosis.  P53 is wild-type p16 is overall positive however not fully strong  blocklike positive.  The proliferation rate by Ki-67 shows  full-thickness positivity.  The findings are consistent with high-grade  VIN and most consistent with usual/HPV associated VIN.   B. LABIA, LEFT LABIA MINORA, SKIN TAG:  -  Fibroepithelial polyp, negative for dysplasia.   Problem List: Patient Active Problem List   Diagnosis Date Noted   Invasive ductal carcinoma of right breast (HCC) 02/22/2024   Acute non-recurrent pansinusitis 12/17/2020   History of shingles 12/17/2020   Skin sensitivity 12/17/2020   History of COVID-19 12/17/2020   Heat intolerance 11/03/2020   Increased risk of breast cancer    Diffuse nontoxic goiter 06/07/2018   Family history of breast cancer 01/12/2018   Abnormal blood chemistry 04/20/2016   Abnormal liver enzymes 04/20/2016   Interstitial cystitis 04/20/2016   Ganglion cyst of wrist 04/20/2016   Occipital mass 04/20/2016   Herpes zoster without complication 07/08/2009    Past Medical History: Past  Medical History:  Diagnosis Date   Abnormal Pap smear of cervix 1999   LGSIL   BRCA negative 2013; 2/19   BRCA neg; MyRisk neg   Cancer (HCC)    rt breast   Complication of anesthesia    woke up during wisdom teeth surgery   Family history of breast cancer    has tested negative for HBOC in 2013/ negative Myrisk test 2019/ lifetime risk 24.6%   GERD (gastroesophageal reflux disease)    Goiter    Headache    cluster HA   History of chicken pox    Increased risk of breast cancer    IBIS=25%, riskscore=24.6%   Interstitial cystitis    Screening for colon cancer 10/2020   Cologuard NEG; repeat after 3 yrs   Shingles     Past Surgical History: Past Surgical History:  Procedure Laterality Date   BREAST BIOPSY Right 02/15/2024   US  Bx, ribbon clip,INVASIVE MAMMARY CARCINOMA, NO SPECIAL TYPE. - OVERALL GRADE: 1. - LYMPHOVASCULAR INVASION: NOT IDENTIFIED. - CANCER LENGTH: 9 MM - CALCIFICATIONS: NOT IDENTIFIED. - DUCTAL CARCINOMA IN SITU: PRESENT, INTERMEDIATE GRADE.   BREAST BIOPSY Right 02/15/2024   US  RT BREAST BX W LOC DEV 1ST LESION IMG BX SPEC US  GUIDE 02/15/2024 [facility]   BREAST LUMPECTOMY Right 02/27/2024   Procedure: BREAST LUMPECTOMY;  Surgeon: Eldred Grego, MD;  Location: [facility];  Service: General;  Laterality: Right;  w/ RF tag   COLPOSCOPY  1999   LGSIL Pap, mild dysplasia, VAIN 1   DILATION AND CURETTAGE OF UTERUS  2005   due  to missed abortion   HYSTEROSCOPY WITH D & C N/A 06/27/2015   Procedure: DILATATION AND CURETTAGE /HYSTEROSCOPY;  Surgeon: Margarie Shay Ward, MD;  Location: [facility];  Service: Gynecology;  Laterality: N/A;   savi tag placement Right 02/23/2024   SENTINEL NODE BIOPSY Right 02/27/2024   Procedure: BIOPSY, LYMPH NODE, SENTINEL;  Surgeon: Eldred Grego, MD;  Location: [facility];  Service: General;  Laterality: Right;   WISDOM TOOTH EXTRACTION      Past Gynecologic History:  Menarche: unknown Menstrual details: she recently  stopped OCPs due to breast cancer diagnosis.  Menses regular: yes - only two since stopped OCPs Last Menstrual Period: 04/03/2024 History of Abnormal pap: yes, low-grade squamous intraepithelial neoplasia (LGSIL - encompassing HPV,mild dysplasia,CIN I) 1999 Last pap: 11/19/2021 NIML/HRHPV negative Contraception: vasectomy Sexually active: yes  OB History:  OB History  Gravida Para Term Preterm AB Living  3 2 2  1 2   SAB IAB Ectopic Multiple Live Births  1    2    # Outcome Date GA Lbr Len/2nd Weight Sex Type Anes PTL Lv  3 Term 05/15/05 [redacted]w[redacted]d  10 lb 11 oz (4.848 kg) M Vag-Spont   LIV  2 Term 02/06/03 [redacted]w[redacted]d  8 lb 4 oz (3.742 kg) F Vag-Vacuum EPI  LIV     Birth Comments: VAD for terminal bradycardia  1 SAB             Family History: Family History  Problem Relation Age of Onset   Hypertension Mother    Breast cancer Mother 63   Crohn's disease Father    Scoliosis Sister    Crohn's disease Paternal Grandmother    Breast cancer Paternal Grandmother 82   CAD Paternal Grandfather        stent   Breast cancer Cousin 54       2nd cousin paternal side. died in 05-05-03   Colon cancer Paternal Uncle 44   Breast cancer Cousin 74       2nd cousin on paternal side   Hypertension Maternal Aunt    Multiple myeloma Maternal Aunt     Social History: Social History   Socioeconomic History   Marital status: Married    Spouse name: Johnathan   Number of children: 2   Years of education: Not on file   Highest education level: Not on file  Occupational History   Occupation: Journalist, newspaper  Tobacco Use   Smoking status: Never   Smokeless tobacco: Never  Vaping Use   Vaping status: Never Used  Substance and Sexual Activity   Alcohol use: No   Drug use: No   Sexual activity: Yes    Partners: Male    Birth control/protection: Surgical    Comment: vasectomy  Other Topics Concern   Not on file  Social History Narrative   Not on file   Social Drivers of Health   Financial  Resource Strain: Low Risk  (02/21/2024)   Received from [facility]   Overall Financial Resource Strain (CARDIA)    Difficulty of Paying Living Expenses: Not hard at all  Food Insecurity: No Food Insecurity (02/21/2024)   Received from [facility]   Hunger Vital Sign    Worried About Running Out of Food in the Last Year: Never true    Ran Out of Food in the Last Year: Never true  Transportation Needs: No Transportation Needs (02/21/2024)   Received from [facility]   [facility] - Transportation  In the past 12 months, has lack of transportation kept you from medical appointments or from getting medications?: No    Lack of Transportation (Non-Medical): No  Physical Activity: Not on file  Stress: Not on file  Social Connections: Not on file  Intimate Partner Violence: Not on file    Allergies: No Known Allergies  Current Medications: Current Outpatient Medications  Medication Sig Dispense Refill   ibuprofen  (ADVIL ) 200 MG tablet Take 400 mg by mouth every 8 (eight) hours as needed (headaches.).     levocetirizine (XYZAL) 5 MG tablet Take 5 mg by mouth every evening.     Multiple Vitamins-Minerals (MULTIVITAMIN WITH MINERALS) tablet Take 1 tablet by mouth in the morning.     No current facility-administered medications for this visit.    Review of Systems  General: negative for fevers, changes in weight or night sweats Skin: negative for changes in moles or sores or rash Eyes: negative for changes in vision HEENT: negative for change in hearing, tinnitus, voice changes Pulmonary: negative for dyspnea, orthopnea, productive cough, wheezing Cardiac: negative for palpitations, pain Gastrointestinal: negative for nausea, vomiting, constipation, diarrhea, hematemesis, hematochezia Genitourinary/Sexual: negative for dysuria, retention, hematuria, incontinence Ob/Gyn:  negative for abnormal bleeding, or pain Musculoskeletal: negative for  pain, joint pain, back pain Hematology: negative for easy bruising, abnormal bleeding Neurologic/Psych: negative for headaches, seizures, paralysis, weakness, numbness   Objective:  Physical Examination:  BP 128/84   Pulse (!) 105   Temp 98.6 F (37 C)   Resp 20   Wt 195 lb 9.6 oz (88.7 kg)   LMP 03/09/2024 (Exact Date)   SpO2 100%   BMI 30.64 kg/m    ECOG Performance Status: 0 - Asymptomatic   GENERAL: Patient is a well appearing female in no acute distress HEENT:  PERRL, neck supple with midline trachea.  NODES:  No cervical, supraclavicular, axillary, or inguinal lymphadenopathy palpated.  LUNGS:  Clear to auscultation bilaterally.  No wheezes or rhonchi. HEART:  Regular rate and rhythm.  ABDOMEN:  Soft, nontender, nondistended. No ascites or masses EXTREMITIES:  No peripheral edema.   SKIN:  Clear with no obvious rashes or skin changes. No nail dyscrasia. NEURO:  Nonfocal. Well oriented.  Appropriate affect.    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 Entire lesion ~ 2 cm.  Vagina: normal without discharge Cervix: normal cervix without lesions, polyps or tenderness, pap smear done today Uterus: normal size, shape, consistency, no mass or tenderness BME: adnexa normal in size without mass or tenderness Anal: no external abnormal lesions noted    Lab Review Lab Results  Component Value Date   WBC 4.3 04/04/2024   HGB 14.3 04/04/2024   HCT 42.3 04/04/2024   MCV 92.4 04/04/2024   PLT 314 04/04/2024   Lab Results  Component Value Date   NA 140 02/10/2024   CL 105 02/10/2024   K 4.0 02/10/2024   CO2 22 02/10/2024   BUN 13 02/10/2024   CREATININE 0.74 02/10/2024   EGFR 100 02/10/2024   CALCIUM 9.2 02/10/2024   ALBUMIN 4.1 02/10/2024   GLUCOSE 90 02/10/2024   Lab Results  Component Value Date   HGBA1C 5.6 02/10/2024     Radiologic  Imaging: 04/24/2021 CLINICAL DATA:  RIGHT lower quadrant abdominal pain for 1 week, irregular menses for months, LMP 04/05/2021   EXAM: TRANSABDOMINAL AND TRANSVAGINAL ULTRASOUND OF PELVIS   TECHNIQUE:  Both transabdominal and transvaginal ultrasound examinations of the pelvis were performed. Transabdominal technique was performed for global imaging of the pelvis including uterus, ovaries, adnexal regions, and pelvic cul-de-sac. It was necessary to proceed with endovaginal exam following the transabdominal exam to visualize the uterus, endometrium, and ovaries.   COMPARISON:  11/30/2013   FINDINGS: Uterus   Measurements: 8.3 x 4.4 x 5.4 cm = volume: 105 mL. Anteverted. Nabothian cysts at cervix. Subserosal leiomyoma at posterior fundus 2.2 x 1.9 x 3.1 cm. No additional masses.   Endometrium   Thickness: 6 mm.  No endometrial fluid or focal abnormality   Right ovary   Not visualized, likely obscured by bowel   Left ovary   Measurements: 2.3 x 1.4 x 1.7 cm = volume: 2.8 mL. Small exophytic follicle; no follow-up imaging recommended. No additional mass.   Other findings   No free pelvic fluid or adnexal masses.   IMPRESSION: Subserosal leiomyoma at posterior uterine fundus 3.1 cm greatest size.   Nonvisualization of RIGHT ovary.   Remainder of exam normal.    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   Medical co-morbidities complicating care: recent breast cancer diagnosis Plan:   Problem List Items Addressed This Visit   None   We discussed options for management including surgery with WLE vs ablation and topical therapies such as imiquimod. Based on VIN3 result, we recommend definitive surgical evaluation with vulvar colposcopy, WLE, and vulvar biopsies to be determine based on colposcopic findings.  Risks were discussed in detail. These include infection, bleeding, wound separation, recurrent/persistent VIN. Anesthesia will  review risks of anesthesia related issues.   Suggested return to clinic in  4-6 weeks.    The patient's diagnosis, an outline of the further diagnostic and laboratory studies which will be required, the recommendation, and alternatives were discussed.  All questions were answered to the patient's satisfaction.  A total of 60 minutes were spent with the patient/family today; >50% was spent in education, counseling and coordination of care for dysplasia.    Rayn Enderson Iola Manila, MD    CC:  [facility] [address] [place],  [place] [address] [phone]

## 2024-04-12 ENCOUNTER — Other Ambulatory Visit: Payer: Self-pay

## 2024-04-12 ENCOUNTER — Ambulatory Visit
Admission: RE | Admit: 2024-04-12 | Discharge: 2024-04-12 | Disposition: A | Discharge status: Home / Self Care | Source: Ambulatory Visit | Attending: Radiation Oncology | Admitting: Radiation Oncology

## 2024-04-12 DIAGNOSIS — C50411 Malignant neoplasm of upper-outer quadrant of right female breast: Secondary | ICD-10-CM | POA: Diagnosis not present

## 2024-04-12 LAB — RAD ONC ARIA SESSION SUMMARY
Course Elapsed Days: 14
Plan Fractions Treated to Date: 11
Plan Prescribed Dose Per Fraction: 2.66 Gy
Plan Total Fractions Prescribed: 16
Plan Total Prescribed Dose: 42.56 Gy
Reference Point Dosage Given to Date: 29.26 Gy
Reference Point Session Dosage Given: 2.66 Gy
Session Number: 11

## 2024-04-13 ENCOUNTER — Ambulatory Visit
Admission: RE | Admit: 2024-04-13 | Discharge: 2024-04-13 | Disposition: A | Discharge status: Home / Self Care | Source: Ambulatory Visit | Attending: Radiation Oncology | Admitting: Radiation Oncology

## 2024-04-13 ENCOUNTER — Other Ambulatory Visit: Payer: Self-pay

## 2024-04-13 DIAGNOSIS — C50411 Malignant neoplasm of upper-outer quadrant of right female breast: Secondary | ICD-10-CM | POA: Diagnosis not present

## 2024-04-13 LAB — RAD ONC ARIA SESSION SUMMARY
Course Elapsed Days: 15
Plan Fractions Treated to Date: 12
Plan Prescribed Dose Per Fraction: 2.66 Gy
Plan Total Fractions Prescribed: 16
Plan Total Prescribed Dose: 42.56 Gy
Reference Point Dosage Given to Date: 31.92 Gy
Reference Point Session Dosage Given: 2.66 Gy
Session Number: 12

## 2024-04-15 LAB — IGP, APTIMA HPV: HPV Aptima: NEGATIVE

## 2024-04-17 ENCOUNTER — Other Ambulatory Visit: Payer: Self-pay

## 2024-04-17 ENCOUNTER — Ambulatory Visit
Admission: RE | Admit: 2024-04-17 | Discharge: 2024-04-17 | Disposition: A | Discharge status: Home / Self Care | Source: Ambulatory Visit | Attending: Radiation Oncology | Admitting: Radiation Oncology

## 2024-04-17 DIAGNOSIS — C50411 Malignant neoplasm of upper-outer quadrant of right female breast: Secondary | ICD-10-CM | POA: Diagnosis not present

## 2024-04-17 LAB — RAD ONC ARIA SESSION SUMMARY
Course Elapsed Days: 19
Plan Fractions Treated to Date: 13
Plan Prescribed Dose Per Fraction: 2.66 Gy
Plan Total Fractions Prescribed: 16
Plan Total Prescribed Dose: 42.56 Gy
Reference Point Dosage Given to Date: 34.58 Gy
Reference Point Session Dosage Given: 2.66 Gy
Session Number: 13

## 2024-04-18 ENCOUNTER — Other Ambulatory Visit: Payer: Self-pay

## 2024-04-18 ENCOUNTER — Ambulatory Visit: Payer: Self-pay | Admitting: Obstetrics and Gynecology

## 2024-04-18 ENCOUNTER — Ambulatory Visit
Admission: RE | Admit: 2024-04-18 | Discharge: 2024-04-18 | Disposition: A | Discharge status: Home / Self Care | Source: Ambulatory Visit | Attending: Radiation Oncology | Admitting: Radiation Oncology

## 2024-04-18 ENCOUNTER — Inpatient Hospital Stay

## 2024-04-18 DIAGNOSIS — C50911 Malignant neoplasm of unspecified site of right female breast: Secondary | ICD-10-CM

## 2024-04-18 DIAGNOSIS — C50411 Malignant neoplasm of upper-outer quadrant of right female breast: Secondary | ICD-10-CM | POA: Diagnosis not present

## 2024-04-18 LAB — RAD ONC ARIA SESSION SUMMARY
Course Elapsed Days: 20
Plan Fractions Treated to Date: 14
Plan Prescribed Dose Per Fraction: 2.66 Gy
Plan Total Fractions Prescribed: 16
Plan Total Prescribed Dose: 42.56 Gy
Reference Point Dosage Given to Date: 37.24 Gy
Reference Point Session Dosage Given: 2.66 Gy
Session Number: 14

## 2024-04-18 LAB — CBC (CANCER CENTER ONLY)
HCT: 43.6 % (ref 36.0–46.0)
Hemoglobin: 14.8 g/dL (ref 12.0–15.0)
MCH: 31.4 pg (ref 26.0–34.0)
MCHC: 33.9 g/dL (ref 30.0–36.0)
MCV: 92.4 fL (ref 80.0–100.0)
Platelet Count: 315 10*3/uL (ref 150–400)
RBC: 4.72 MIL/uL (ref 3.87–5.11)
RDW: 11.5 % (ref 11.5–15.5)
WBC Count: 4.3 10*3/uL (ref 4.0–10.5)
nRBC: 0 % (ref 0.0–0.2)

## 2024-04-19 ENCOUNTER — Ambulatory Visit
Admission: RE | Admit: 2024-04-19 | Discharge: 2024-04-19 | Disposition: A | Discharge status: Home / Self Care | Source: Ambulatory Visit | Attending: Radiation Oncology | Admitting: Radiation Oncology

## 2024-04-19 ENCOUNTER — Other Ambulatory Visit: Payer: Self-pay

## 2024-04-19 DIAGNOSIS — C50411 Malignant neoplasm of upper-outer quadrant of right female breast: Secondary | ICD-10-CM | POA: Diagnosis not present

## 2024-04-19 LAB — RAD ONC ARIA SESSION SUMMARY
Course Elapsed Days: 21
Plan Fractions Treated to Date: 15
Plan Prescribed Dose Per Fraction: 2.66 Gy
Plan Total Fractions Prescribed: 16
Plan Total Prescribed Dose: 42.56 Gy
Reference Point Dosage Given to Date: 39.9 Gy
Reference Point Session Dosage Given: 2.66 Gy
Session Number: 15

## 2024-04-20 ENCOUNTER — Other Ambulatory Visit: Payer: Self-pay

## 2024-04-20 ENCOUNTER — Ambulatory Visit
Admission: RE | Admit: 2024-04-20 | Discharge: 2024-04-20 | Disposition: A | Discharge status: Home / Self Care | Source: Ambulatory Visit | Attending: Radiation Oncology | Admitting: Radiation Oncology

## 2024-04-20 DIAGNOSIS — C50411 Malignant neoplasm of upper-outer quadrant of right female breast: Secondary | ICD-10-CM | POA: Diagnosis not present

## 2024-04-20 LAB — RAD ONC ARIA SESSION SUMMARY
Course Elapsed Days: 22
Plan Fractions Treated to Date: 16
Plan Prescribed Dose Per Fraction: 2.66 Gy
Plan Total Fractions Prescribed: 16
Plan Total Prescribed Dose: 42.56 Gy
Reference Point Dosage Given to Date: 42.56 Gy
Reference Point Session Dosage Given: 2.66 Gy
Session Number: 16

## 2024-04-23 ENCOUNTER — Other Ambulatory Visit: Payer: Self-pay

## 2024-04-23 ENCOUNTER — Ambulatory Visit
Admission: RE | Admit: 2024-04-23 | Discharge: 2024-04-23 | Disposition: A | Discharge status: Home / Self Care | Source: Ambulatory Visit | Attending: Radiation Oncology | Admitting: Radiation Oncology

## 2024-04-23 DIAGNOSIS — C50411 Malignant neoplasm of upper-outer quadrant of right female breast: Secondary | ICD-10-CM | POA: Diagnosis present

## 2024-04-23 DIAGNOSIS — Z79899 Other long term (current) drug therapy: Secondary | ICD-10-CM | POA: Insufficient documentation

## 2024-04-23 DIAGNOSIS — E049 Nontoxic goiter, unspecified: Secondary | ICD-10-CM | POA: Diagnosis not present

## 2024-04-23 DIAGNOSIS — Z170 Estrogen receptor positive status [ER+]: Secondary | ICD-10-CM | POA: Insufficient documentation

## 2024-04-23 DIAGNOSIS — D071 Carcinoma in situ of vulva: Secondary | ICD-10-CM | POA: Insufficient documentation

## 2024-04-23 DIAGNOSIS — K219 Gastro-esophageal reflux disease without esophagitis: Secondary | ICD-10-CM | POA: Diagnosis not present

## 2024-04-23 DIAGNOSIS — Z803 Family history of malignant neoplasm of breast: Secondary | ICD-10-CM | POA: Insufficient documentation

## 2024-04-23 DIAGNOSIS — Z800 Family history of malignant neoplasm of digestive organs: Secondary | ICD-10-CM | POA: Insufficient documentation

## 2024-04-23 DIAGNOSIS — Z510 Encounter for antineoplastic radiation therapy: Secondary | ICD-10-CM | POA: Diagnosis not present

## 2024-04-23 DIAGNOSIS — Z807 Family history of other malignant neoplasms of lymphoid, hematopoietic and related tissues: Secondary | ICD-10-CM | POA: Insufficient documentation

## 2024-04-23 LAB — RAD ONC ARIA SESSION SUMMARY
Course Elapsed Days: 25
Plan Fractions Treated to Date: 1
Plan Prescribed Dose Per Fraction: 2 Gy
Plan Total Fractions Prescribed: 5
Plan Total Prescribed Dose: 10 Gy
Reference Point Dosage Given to Date: 2 Gy
Reference Point Session Dosage Given: 2 Gy
Session Number: 17

## 2024-04-24 ENCOUNTER — Ambulatory Visit
Admission: RE | Admit: 2024-04-24 | Discharge: 2024-04-24 | Disposition: A | Discharge status: Home / Self Care | Source: Ambulatory Visit | Attending: Radiation Oncology | Admitting: Radiation Oncology

## 2024-04-24 ENCOUNTER — Other Ambulatory Visit: Payer: Self-pay

## 2024-04-24 ENCOUNTER — Encounter
Admission: RE | Admit: 2024-04-24 | Discharge: 2024-04-24 | Disposition: A | Discharge status: Home / Self Care | Source: Ambulatory Visit | Attending: Obstetrics and Gynecology | Admitting: Obstetrics and Gynecology

## 2024-04-24 DIAGNOSIS — C50411 Malignant neoplasm of upper-outer quadrant of right female breast: Secondary | ICD-10-CM | POA: Diagnosis not present

## 2024-04-24 HISTORY — DX: Other specified postprocedural states: R11.2

## 2024-04-24 HISTORY — DX: Other specified postprocedural states: Z98.890

## 2024-04-24 LAB — RAD ONC ARIA SESSION SUMMARY
Course Elapsed Days: 26
Plan Fractions Treated to Date: 2
Plan Prescribed Dose Per Fraction: 2 Gy
Plan Total Fractions Prescribed: 5
Plan Total Prescribed Dose: 10 Gy
Reference Point Dosage Given to Date: 4 Gy
Reference Point Session Dosage Given: 2 Gy
Session Number: 18

## 2024-04-24 NOTE — Patient Instructions (Addendum)
 Your procedure is scheduled on: Wednesday 05/02/24 To find out your arrival time, please call [phone] between 1PM - 3PM on:   Tuesday 05/01/24 Report to the Registration Desk on the 1st floor of the [facility]. FREE Valet parking is available.  If your arrival time is 6:00 am, do not arrive before that time as the [facility] entrance doors do not open until 6:00 am.  REMEMBER: Instructions that are not followed completely may result in serious medical risk, up to and including death; or upon the discretion of your surgeon and anesthesiologist your surgery may need to be rescheduled.  Do not eat food after midnight the night before surgery.  No gum chewing or hard candies.  You may however, drink CLEAR liquids up to 2 hours before you are scheduled to arrive for your surgery. Do not drink anything within 2 hours of your scheduled arrival time.(If this differs from Dr Devota Fontan instructions you should follow the instructions of the surgeon Dr Randalyn Bushman)  Clear liquids include: - water   - apple juice without pulp - gatorade (not RED colors) - black coffee or tea (Do NOT add milk or creamers to the coffee or tea) Do NOT drink anything that is not on this list.  Type 1 and Type 2 diabetics should only drink water .    One week prior to surgery: Stop Anti-inflammatories (NSAIDS) such as Advil , Aleve, Ibuprofen , Motrin , Naproxen, Naprosyn and Aspirin based products such as Excedrin, Goody's Powder, BC Powder. You may however, continue to take Tylenol  if needed for pain up until the day of surgery.  Stop ANY OVER THE COUNTER supplements and vitamins until after surgery.   Continue taking all prescription medications.   TAKE ONLY THESE MEDICATIONS THE MORNING OF SURGERY WITH A SIP OF WATER :  none  No Alcohol for 24 hours before or after surgery.  No Smoking including e-cigarettes for 24 hours before surgery.  No chewable tobacco products for at least 6 hours before surgery.  No  nicotine patches on the day of surgery.  Do not use any "recreational" drugs for at least a week (preferably 2 weeks) before your surgery.  Please be advised that the combination of cocaine and anesthesia may have negative outcomes, up to and including death. If you test positive for cocaine, your surgery will be cancelled.  On the morning of surgery brush your teeth with toothpaste and water , you may rinse your mouth with mouthwash if you wish. Do not swallow any toothpaste or mouthwash.  Shower the morning of your surgery.  Do not wear lotions, powders, or perfumes.   Do not shave body hair from the neck down 48 hours before surgery.  Wear comfortable clothing (specific to your surgery type) to the hospital.  Do not wear jewelry, make-up, hairpins, clips or nail polish.  For welded (permanent) jewelry: bracelets, anklets, waist bands, etc.  Please have this removed prior to surgery.  If it is not removed, there is a chance that hospital personnel will need to cut it off on the day of surgery. Contact lenses, hearing aids and dentures may not be worn into surgery.  Do not bring valuables to the hospital. [facility] is not responsible for any missing/lost belongings or valuables.   Notify your doctor if there is any change in your medical condition (cold, fever, infection).  If you are being discharged the day of surgery, you will not be allowed to drive home. You will need a responsible individual to drive you home  and stay with you for 24 hours after surgery.   After surgery, you can help prevent lung complications by doing breathing exercises.  Take deep breaths and cough every 1-2 hours. Your doctor may order a device called an Incentive Spirometer to help you take deep breaths.  Surgery Visitation Policy:  Patients undergoing a surgery or procedure may have two family members or support persons with them as long as the person is not COVID-19 positive or experiencing its  symptoms.   Please call the Pre-admissions Testing Dept. at [phone] if you have any questions about these instructions.

## 2024-04-25 ENCOUNTER — Other Ambulatory Visit: Payer: Self-pay

## 2024-04-25 ENCOUNTER — Ambulatory Visit
Admission: RE | Admit: 2024-04-25 | Discharge: 2024-04-25 | Disposition: A | Discharge status: Home / Self Care | Source: Ambulatory Visit | Attending: Radiation Oncology | Admitting: Radiation Oncology

## 2024-04-25 DIAGNOSIS — C50411 Malignant neoplasm of upper-outer quadrant of right female breast: Secondary | ICD-10-CM | POA: Diagnosis not present

## 2024-04-25 LAB — RAD ONC ARIA SESSION SUMMARY
Course Elapsed Days: 27
Plan Fractions Treated to Date: 3
Plan Prescribed Dose Per Fraction: 2 Gy
Plan Total Fractions Prescribed: 5
Plan Total Prescribed Dose: 10 Gy
Reference Point Dosage Given to Date: 6 Gy
Reference Point Session Dosage Given: 2 Gy
Session Number: 19

## 2024-04-26 ENCOUNTER — Ambulatory Visit
Admission: RE | Admit: 2024-04-26 | Discharge: 2024-04-26 | Disposition: A | Discharge status: Home / Self Care | Source: Ambulatory Visit | Attending: Radiation Oncology | Admitting: Radiation Oncology

## 2024-04-26 ENCOUNTER — Other Ambulatory Visit: Payer: Self-pay

## 2024-04-26 DIAGNOSIS — C50411 Malignant neoplasm of upper-outer quadrant of right female breast: Secondary | ICD-10-CM | POA: Diagnosis not present

## 2024-04-26 LAB — RAD ONC ARIA SESSION SUMMARY
Course Elapsed Days: 28
Plan Fractions Treated to Date: 4
Plan Prescribed Dose Per Fraction: 2 Gy
Plan Total Fractions Prescribed: 5
Plan Total Prescribed Dose: 10 Gy
Reference Point Dosage Given to Date: 8 Gy
Reference Point Session Dosage Given: 2 Gy
Session Number: 20

## 2024-04-27 ENCOUNTER — Ambulatory Visit
Admission: RE | Admit: 2024-04-27 | Discharge: 2024-04-27 | Disposition: A | Discharge status: Home / Self Care | Source: Ambulatory Visit | Attending: Radiation Oncology | Admitting: Radiation Oncology

## 2024-04-27 ENCOUNTER — Other Ambulatory Visit: Payer: Self-pay

## 2024-04-27 DIAGNOSIS — C50411 Malignant neoplasm of upper-outer quadrant of right female breast: Secondary | ICD-10-CM | POA: Diagnosis not present

## 2024-04-27 LAB — RAD ONC ARIA SESSION SUMMARY
Course Elapsed Days: 29
Plan Fractions Treated to Date: 5
Plan Prescribed Dose Per Fraction: 2 Gy
Plan Total Fractions Prescribed: 5
Plan Total Prescribed Dose: 10 Gy
Reference Point Dosage Given to Date: 10 Gy
Reference Point Session Dosage Given: 2 Gy
Session Number: 21

## 2024-04-30 ENCOUNTER — Inpatient Hospital Stay: Attending: Oncology | Admitting: Oncology

## 2024-04-30 ENCOUNTER — Encounter: Payer: Self-pay | Admitting: Oncology

## 2024-04-30 VITALS — BP 137/85 | HR 84 | Temp 97.7°F | Resp 16 | Ht 67.0 in | Wt 197.0 lb

## 2024-04-30 DIAGNOSIS — Z923 Personal history of irradiation: Secondary | ICD-10-CM | POA: Diagnosis not present

## 2024-04-30 DIAGNOSIS — Z1721 Progesterone receptor positive status: Secondary | ICD-10-CM | POA: Diagnosis not present

## 2024-04-30 DIAGNOSIS — Z170 Estrogen receptor positive status [ER+]: Secondary | ICD-10-CM | POA: Diagnosis not present

## 2024-04-30 DIAGNOSIS — C50919 Malignant neoplasm of unspecified site of unspecified female breast: Secondary | ICD-10-CM

## 2024-04-30 DIAGNOSIS — C50411 Malignant neoplasm of upper-outer quadrant of right female breast: Secondary | ICD-10-CM | POA: Insufficient documentation

## 2024-04-30 DIAGNOSIS — Z1732 Human epidermal growth factor receptor 2 negative status: Secondary | ICD-10-CM | POA: Diagnosis not present

## 2024-04-30 MED ORDER — LETROZOLE 2.5 MG PO TABS
2.5000 mg | ORAL_TABLET | Freq: Every day | ORAL | 3 refills | Status: AC
Start: 2024-04-30 — End: ?

## 2024-04-30 NOTE — Progress Notes (Signed)
 [facility]  Telephone:(336) [phone] Fax:(336) [phone]  ID: Amanda Coffey OB: 1976-10-30  MR#: [id number]  AOZ#:[id number]  Patient Care Team: Pcp, No as PCP - General Waverly Hageman, RN as Oncology Nurse Navigator Adrian Alba, Deadra Everts, MD as Consulting Physician (Oncology) Glenis Langdon, MD as Consulting Physician (Radiation Oncology)  CHIEF COMPLAINT: Pathologic stage Ia ER/PR positive, HER2 negative invasive carcinoma of the right breast.  Oncotype Dx score 17, low risk.  INTERVAL HISTORY: Patient returns to clinic today at the conclusion of her XRT for further evaluation and initiation of letrozole.  She tolerated treatment well without significant side effects.  She currently feels well and is asymptomatic. She has no neurologic complaints.  She denies any recent fevers or illnesses.  She has a good appetite and denies weight loss.  She has no chest pain, shortness of breath, cough, or hemoptysis.  She denies any nausea, vomiting, constipation, or diarrhea.  She has no urinary complaints.  Patient offers no specific complaints today.  REVIEW OF SYSTEMS:   Review of Systems  Constitutional: Negative.  Negative for fever, malaise/fatigue and weight loss.  Respiratory: Negative.  Negative for cough, hemoptysis and shortness of breath.   Cardiovascular: Negative.  Negative for chest pain and leg swelling.  Gastrointestinal: Negative.  Negative for abdominal pain.  Genitourinary: Negative.  Negative for dysuria.  Musculoskeletal: Negative.  Negative for back pain.  Skin: Negative.  Negative for rash.  Neurological: Negative.  Negative for dizziness, focal weakness, weakness and headaches.  Psychiatric/Behavioral: Negative.  The patient is not nervous/anxious.     As per HPI. Otherwise, a complete review of systems is negative.  PAST MEDICAL HISTORY: Past Medical History:  Diagnosis Date   Abnormal Pap smear of cervix 1999   LGSIL   BRCA negative 2013; 2/19    BRCA neg; MyRisk neg   Cancer (HCC)    rt breast   Complication of anesthesia    woke up during wisdom teeth surgery   Family history of breast cancer    has tested negative for HBOC in 2013/ negative Myrisk test 2019/ lifetime risk 24.6%   GERD (gastroesophageal reflux disease)    Goiter    Headache    cluster HA   History of chicken pox    Increased risk of breast cancer    IBIS=25%, riskscore=24.6%   Interstitial cystitis    PONV (postoperative nausea and vomiting)    Screening for colon cancer 10/2020   Cologuard NEG; repeat after 3 yrs   Shingles     PAST SURGICAL HISTORY: Past Surgical History:  Procedure Laterality Date   BREAST BIOPSY Right 02/15/2024   US  Bx, ribbon clip,INVASIVE MAMMARY CARCINOMA, NO SPECIAL TYPE. - OVERALL GRADE: 1. - LYMPHOVASCULAR INVASION: NOT IDENTIFIED. - CANCER LENGTH: 9 MM - CALCIFICATIONS: NOT IDENTIFIED. - DUCTAL CARCINOMA IN SITU: PRESENT, INTERMEDIATE GRADE.   BREAST BIOPSY Right 02/15/2024   US  RT BREAST BX W LOC DEV 1ST LESION IMG BX SPEC US  GUIDE 02/15/2024 [facility]   BREAST LUMPECTOMY Right 02/27/2024   Procedure: BREAST LUMPECTOMY;  Surgeon: Eldred Grego, MD;  Location: [facility];  Service: General;  Laterality: Right;  w/ RF tag   COLPOSCOPY  1999   LGSIL Pap, mild dysplasia, VAIN 1   DILATION AND CURETTAGE OF UTERUS  2005   due to missed abortion   HYSTEROSCOPY WITH D & C N/A 06/27/2015   Procedure: DILATATION AND CURETTAGE /HYSTEROSCOPY;  Surgeon: Margarie Shay Ward, MD;  Location: [facility];  Service:  Gynecology;  Laterality: N/A;   savi tag placement Right 02/23/2024   SENTINEL NODE BIOPSY Right 02/27/2024   Procedure: BIOPSY, LYMPH NODE, SENTINEL;  Surgeon: Eldred Grego, MD;  Location: [facility];  Service: General;  Laterality: Right;   WISDOM TOOTH EXTRACTION     woke up during extraction    FAMILY HISTORY: Family History  Problem Relation Age of Onset   Hypertension Mother    Breast cancer Mother 16    Crohn's disease Father    Scoliosis Sister    Crohn's disease Paternal Grandmother    Breast cancer Paternal Grandmother 32   CAD Paternal Grandfather        stent   Breast cancer Cousin 38       2nd cousin paternal side. died in May 12, 2003   Colon cancer Paternal Uncle 49   Breast cancer Cousin 43       2nd cousin on paternal side   Hypertension Maternal Aunt    Multiple myeloma Maternal Aunt     ADVANCED DIRECTIVES (Y/N):  N  HEALTH MAINTENANCE: Social History   Tobacco Use   Smoking status: Never   Smokeless tobacco: Never  Vaping Use   Vaping status: Never Used  Substance Use Topics   Alcohol use: No   Drug use: No     Colonoscopy:  PAP:  Bone density:  Lipid panel:  No Known Allergies  Current Outpatient Medications  Medication Sig Dispense Refill   ibuprofen  (ADVIL ) 200 MG tablet Take 400 mg by mouth every 8 (eight) hours as needed for moderate pain (pain score 4-6) (headaches.).     levocetirizine (XYZAL) 5 MG tablet Take 5 mg by mouth at bedtime.     Multiple Vitamins-Minerals (MULTIVITAMIN WITH MINERALS) tablet Take 2 tablets by mouth in the morning.     No current facility-administered medications for this visit.    OBJECTIVE: Vitals:   04/30/24 1010  BP: 137/85  Pulse: 84  Resp: 16  Temp: 97.7 F (36.5 C)  SpO2: 100%     Body mass index is 30.85 kg/m.    ECOG FS:0 - Asymptomatic  General: Well-developed, well-nourished, no acute distress. Eyes: Pink conjunctiva, anicteric sclera. HEENT: Normocephalic, moist mucous membranes. Lungs: No audible wheezing or coughing. Heart: Regular rate and rhythm. Abdomen: Soft, nontender, no obvious distention. Musculoskeletal: No edema, cyanosis, or clubbing. Neuro: Alert, answering all questions appropriately. Cranial nerves grossly intact. Skin: No rashes or petechiae noted. Psych: Normal affect.  LAB RESULTS:  Lab Results  Component Value Date   NA 140 02/10/2024   K 4.0 02/10/2024   CL 105  02/10/2024   CO2 22 02/10/2024   GLUCOSE 90 02/10/2024   BUN 13 02/10/2024   CREATININE 0.74 02/10/2024   CALCIUM 9.2 02/10/2024   PROT 6.3 02/10/2024   ALBUMIN 4.1 02/10/2024   AST 18 02/10/2024   ALT 28 02/10/2024   ALKPHOS 83 02/10/2024   BILITOT 0.4 02/10/2024   GFRNONAA 104 12/27/2016   GFRAA 120 12/27/2016    Lab Results  Component Value Date   WBC 4.3 04/18/2024   NEUTROABS 3.8 04/23/2021   HGB 14.8 04/18/2024   HCT 43.6 04/18/2024   MCV 92.4 04/18/2024   PLT 315 04/18/2024     STUDIES: No results found.   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  Oncotype Dx score 17, low risk.  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  Oncotype Dx score 17, low risk: Patient underwent lumpectomy on February 27, 2024.  Given her low risk Oncotype score she did not require adjuvant chemotherapy.  She completed adjuvant XRT on April 27, 2024. Patient thinks that she is perimenopausal, therefore she will benefit from letrozole for a total of 5 years and will complete treatment in June 2030.  Return to clinic in 3 months for routine evaluation.   Bone health: Will get a baseline bone mineral density in the next 1 to 2 weeks.   Genetic testing: Patient reports negative testing in the past. VIN3: Patient undergoing surgery on May 02, 2024 with Dr. Randalyn Bushman.   Patient expressed understanding and was in agreement with this plan. She also understands that She can call clinic at any time with any questions, concerns, or complaints.    Cancer Staging  Invasive ductal carcinoma of right breast [facility]) Staging form: Breast, AJCC 8th Edition - Pathologic stage from 03/19/2024: Stage IA (pT1b, pN0, cM0, G2, ER+, PR+, HER2-, Oncotype DX score: 17) - Signed by Shellie Dials, MD on 03/19/2024 Stage prefix: Initial diagnosis Multigene prognostic tests performed: Oncotype  DX Recurrence score range: Greater than or equal to 11 Histologic grading system: 3 grade system   Shellie Dials, MD   04/30/2024 10:29 AM

## 2024-04-30 NOTE — Radiation Completion Notes (Signed)
 Patient Name: Amanda Coffey, Amanda Coffey MRN: [id number] Date of Birth: 1976-07-16 Referring Physician: Lannette Pitter, M.D. Date of Service: 2024-04-30 Radiation Oncologist: Glenis Langdon, M.D. 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  This measured 7 x 5 x 6 mm.  By ultrasound there was no suspicious right axillary adenopathy.  She underwent targeted ultrasound which was positive for invasive mammary.  She went on to have a wide local excision and sentinel node biopsy for a 9 mm invasive mammary carcinoma overall grade 1.  Margins were clear at 1 cm.  1 sentinel lymph node was negative for malignancy.  She had 1 sentinel lymph node examined which was negative.  Tumor was strongly ER/PR positive HER2/neu not overexpressed.  She had an Oncotype Dx performed with a recurrence score of 17 she will not receive systemic treatment she is seen today for radiation oncology opinion.  She is doing well.  She specifically denies breast tenderness cough or bone pain.      ==========DELIVERED PLANS==========  First Treatment Date: 2024-03-29 Last Treatment Date: 2024-04-27   Plan Name: Breast_R Site: Breast, Right Technique: 3D Mode: Photon Dose Per Fraction: 2.66 Gy Prescribed Dose (Delivered / Prescribed): 42.56 Gy / 42.56 Gy Prescribed Fxs (Delivered / Prescribed): 16 / 16   Plan Name: Breast_Rt_Bst Site: Breast, Right Technique: 3D Mode: Photon Dose Per Fraction: 2 Gy Prescribed Dose (Delivered / Prescribed): 10 Gy / 10 Gy Prescribed Fxs  (Delivered / Prescribed): 5 / 5     ==========ON TREATMENT VISIT DATES========== 2024-04-03, 2024-04-10, 2024-04-17, 2024-04-24     ==========UPCOMING VISITS==========       ==========APPENDIX - ON TREATMENT VISIT NOTES==========   See weekly On Treatment Notes in Epic for details in the Media tab (listed as Progress notes on the On Treatment Visit Dates listed above).

## 2024-05-01 ENCOUNTER — Encounter: Payer: Self-pay | Admitting: *Deleted

## 2024-05-02 ENCOUNTER — Ambulatory Visit
Admission: RE | Admit: 2024-05-02 | Discharge: 2024-05-02 | Disposition: A | Discharge status: Home / Self Care | Attending: Obstetrics and Gynecology | Admitting: Obstetrics and Gynecology

## 2024-05-02 ENCOUNTER — Ambulatory Visit

## 2024-05-02 ENCOUNTER — Encounter: Payer: Self-pay | Admitting: Obstetrics and Gynecology

## 2024-05-02 ENCOUNTER — Encounter
Admission: RE | Disposition: A | Discharge status: Home / Self Care | Payer: Self-pay | Source: Home / Self Care | Attending: Obstetrics and Gynecology

## 2024-05-02 ENCOUNTER — Other Ambulatory Visit: Payer: Self-pay | Admitting: Nurse Practitioner

## 2024-05-02 ENCOUNTER — Other Ambulatory Visit: Payer: Self-pay

## 2024-05-02 DIAGNOSIS — K219 Gastro-esophageal reflux disease without esophagitis: Secondary | ICD-10-CM | POA: Diagnosis not present

## 2024-05-02 DIAGNOSIS — D071 Carcinoma in situ of vulva: Secondary | ICD-10-CM

## 2024-05-02 DIAGNOSIS — C50919 Malignant neoplasm of unspecified site of unspecified female breast: Secondary | ICD-10-CM | POA: Insufficient documentation

## 2024-05-02 DIAGNOSIS — Z419 Encounter for procedure for purposes other than remedying health state, unspecified: Secondary | ICD-10-CM

## 2024-05-02 HISTORY — PX: EXAM UNDER ANESTHESIA, PELVIC: SHX7461

## 2024-05-02 HISTORY — PX: VULVECTOMY PARTIAL: SHX6187

## 2024-05-02 HISTORY — PX: VULVA /PERINEUM BIOPSY: SHX319

## 2024-05-02 LAB — POCT PREGNANCY, URINE: Preg Test, Ur: NEGATIVE

## 2024-05-02 SURGERY — EXAM UNDER ANESTHESIA, PELVIC
Anesthesia: General | Site: Vulva

## 2024-05-02 MED ORDER — CEFAZOLIN SODIUM-DEXTROSE 2-4 GM/100ML-% IV SOLN
INTRAVENOUS | Status: AC
  Filled 2024-05-02: qty 100

## 2024-05-02 MED ORDER — ACETAMINOPHEN 10 MG/ML IV SOLN
INTRAVENOUS | Status: AC
  Filled 2024-05-02: qty 100

## 2024-05-02 MED ORDER — DEXAMETHASONE SODIUM PHOSPHATE 10 MG/ML IJ SOLN
INTRAMUSCULAR | Status: AC
  Filled 2024-05-02: qty 1

## 2024-05-02 MED ORDER — ROCURONIUM BROMIDE 10 MG/ML (PF) SYRINGE
PREFILLED_SYRINGE | INTRAVENOUS | Status: AC
  Filled 2024-05-02: qty 10

## 2024-05-02 MED ORDER — FENTANYL CITRATE (PF) 100 MCG/2ML IJ SOLN
INTRAMUSCULAR | Status: DC | PRN
  Administered 2024-05-02 (×2): 25 ug via INTRAVENOUS

## 2024-05-02 MED ORDER — CEFAZOLIN SODIUM-DEXTROSE 2-4 GM/100ML-% IV SOLN
2.0000 g | INTRAVENOUS | Status: AC
  Administered 2024-05-02: 2 g via INTRAVENOUS

## 2024-05-02 MED ORDER — ACETIC ACID 3 % SOLN
Status: DC | PRN
  Administered 2024-05-02: 1 via TOPICAL

## 2024-05-02 MED ORDER — 0.9 % SODIUM CHLORIDE (POUR BTL) OPTIME
TOPICAL | Status: DC | PRN
  Administered 2024-05-02: 1000 mL

## 2024-05-02 MED ORDER — CHLORHEXIDINE GLUCONATE CLOTH 2 % EX PADS: 6.0000 | MEDICATED_PAD | Freq: Once | CUTANEOUS | Status: DC

## 2024-05-02 MED ORDER — BUPIVACAINE HCL (PF) 0.25 % IJ SOLN
INTRAMUSCULAR | Status: DC | PRN
  Administered 2024-05-02: 7 mL

## 2024-05-02 MED ORDER — LACTATED RINGERS IV SOLN
INTRAVENOUS | Status: DC
Start: 2024-05-02 — End: 2024-05-02

## 2024-05-02 MED ORDER — DEXAMETHASONE SODIUM PHOSPHATE 10 MG/ML IJ SOLN
INTRAMUSCULAR | Status: DC | PRN
  Administered 2024-05-02: 10 mg via INTRAVENOUS

## 2024-05-02 MED ORDER — LIDOCAINE HCL (CARDIAC) PF 100 MG/5ML IV SOSY
PREFILLED_SYRINGE | INTRAVENOUS | Status: DC | PRN
  Administered 2024-05-02: 60 mg via INTRAVENOUS

## 2024-05-02 MED ORDER — BUPIVACAINE LIPOSOME 1.3 % IJ SUSP
INTRAMUSCULAR | Status: AC
  Filled 2024-05-02: qty 20

## 2024-05-02 MED ORDER — FENTANYL CITRATE (PF) 100 MCG/2ML IJ SOLN
INTRAMUSCULAR | Status: AC
  Filled 2024-05-02: qty 2

## 2024-05-02 MED ORDER — DEXMEDETOMIDINE HCL IN NACL 200 MCG/50ML IV SOLN
INTRAVENOUS | Status: DC | PRN
Start: 2024-05-02 — End: 2024-05-02
  Administered 2024-05-02: 8 ug via INTRAVENOUS

## 2024-05-02 MED ORDER — IODINE STRONG (LUGOLS) 5 % PO SOLN
ORAL | Status: AC
  Filled 2024-05-02: qty 1

## 2024-05-02 MED ORDER — BUPIVACAINE LIPOSOME 1.3 % IJ SUSP: 20.0000 mL | Freq: Once | INTRAMUSCULAR | Status: DC

## 2024-05-02 MED ORDER — PROPOFOL 1000 MG/100ML IV EMUL
INTRAVENOUS | Status: AC
  Filled 2024-05-02: qty 100

## 2024-05-02 MED ORDER — MIDAZOLAM HCL 2 MG/2ML IJ SOLN
INTRAMUSCULAR | Status: DC | PRN
  Administered 2024-05-02: 2 mg via INTRAVENOUS

## 2024-05-02 MED ORDER — CHLORHEXIDINE GLUCONATE 0.12 % MT SOLN
OROMUCOSAL | Status: AC
  Filled 2024-05-02: qty 15

## 2024-05-02 MED ORDER — ORAL CARE MOUTH RINSE: 15.0000 mL | Freq: Once | OROMUCOSAL | Status: AC

## 2024-05-02 MED ORDER — ACETIC ACID 3 % SOLN
Status: AC
  Filled 2024-05-02: qty 500

## 2024-05-02 MED ORDER — ONDANSETRON HCL 4 MG/2ML IJ SOLN
INTRAMUSCULAR | Status: DC | PRN
  Administered 2024-05-02: 4 mg via INTRAVENOUS

## 2024-05-02 MED ORDER — PROPOFOL 10 MG/ML IV BOLUS
INTRAVENOUS | Status: DC | PRN
  Administered 2024-05-02: 150 ug/kg/min via INTRAVENOUS
  Administered 2024-05-02: 180 mg via INTRAVENOUS

## 2024-05-02 MED ORDER — LIDOCAINE HCL (PF) 2 % IJ SOLN
INTRAMUSCULAR | Status: AC
  Filled 2024-05-02: qty 5

## 2024-05-02 MED ORDER — MONSELS FERRIC SUBSULFATE EX SOLN
CUTANEOUS | Status: AC
  Filled 2024-05-02: qty 8

## 2024-05-02 MED ORDER — ACETAMINOPHEN 10 MG/ML IV SOLN
INTRAVENOUS | Status: DC | PRN
  Administered 2024-05-02: 1000 mg via INTRAVENOUS

## 2024-05-02 MED ORDER — CHLORHEXIDINE GLUCONATE 0.12 % MT SOLN
15.0000 mL | Freq: Once | OROMUCOSAL | Status: AC
  Administered 2024-05-02: 15 mL via OROMUCOSAL

## 2024-05-02 MED ORDER — BUPIVACAINE LIPOSOME 1.3 % IJ SUSP
INTRAMUSCULAR | Status: DC | PRN
  Administered 2024-05-02: 5 mL

## 2024-05-02 MED ORDER — PROPOFOL 10 MG/ML IV BOLUS
INTRAVENOUS | Status: AC
  Filled 2024-05-02: qty 20

## 2024-05-02 MED ORDER — BUPIVACAINE HCL (PF) 0.25 % IJ SOLN
INTRAMUSCULAR | Status: AC
  Filled 2024-05-02: qty 30

## 2024-05-02 MED ORDER — GLYCOPYRROLATE 0.2 MG/ML IJ SOLN
INTRAMUSCULAR | Status: AC
  Filled 2024-05-02: qty 1

## 2024-05-02 MED ORDER — ONDANSETRON HCL 4 MG/2ML IJ SOLN
INTRAMUSCULAR | Status: AC
  Filled 2024-05-02: qty 2

## 2024-05-02 MED ORDER — LACTATED RINGERS IV SOLN: INTRAVENOUS | Status: DC | PRN

## 2024-05-02 MED ORDER — MIDAZOLAM HCL 2 MG/2ML IJ SOLN
INTRAMUSCULAR | Status: AC
  Filled 2024-05-02: qty 2

## 2024-05-02 SURGICAL SUPPLY — 36 items
BLADE SURG 15 STRL LF DISP TIS (BLADE) ×2 IMPLANT
BLADE SURG SZ10 CARB STEEL (BLADE) ×2 IMPLANT
BNDG GAUZE DERMACEA FLUFF 4 (GAUZE/BANDAGES/DRESSINGS) IMPLANT
BRIEF MESH DISP 2XL (UNDERPADS AND DIAPERS) ×2 IMPLANT
CATH ROBINSON RED A/P 16FR (CATHETERS) IMPLANT
CNTNR URN SCR LID CUP LEK RST (MISCELLANEOUS) IMPLANT
DRAPE PERI LITHO V/GYN (MISCELLANEOUS) ×2 IMPLANT
DRAPE UNDER BUTTOCK W/FLU (DRAPES) ×2 IMPLANT
DRSG GAUZE FLUFF 36X18 (GAUZE/BANDAGES/DRESSINGS) ×2 IMPLANT
DRSG TELFA 3X8 NADH STRL (GAUZE/BANDAGES/DRESSINGS) ×2 IMPLANT
ELECT BLADE 6.5 EXT (BLADE) IMPLANT
ELECT CAUTERY BLADE 6.4 (BLADE) IMPLANT
ELECTRODE REM PT RTRN 9FT ADLT (ELECTROSURGICAL) IMPLANT
GAUZE 4X4 16PLY ~~LOC~~+RFID DBL (SPONGE) ×4 IMPLANT
GLOVE BIO SURGEON STRL SZ 6.5 (GLOVE) ×2 IMPLANT
GLOVE INDICATOR 7.0 STRL GRN (GLOVE) ×2 IMPLANT
GOWN STRL REUS W/ TWL LRG LVL3 (GOWN DISPOSABLE) ×4 IMPLANT
MANIFOLD NEPTUNE II (INSTRUMENTS) ×2 IMPLANT
MARKER SKIN DUAL TIP RULER LAB (MISCELLANEOUS) ×2 IMPLANT
NDL HYPO 22X1.5 SAFETY MO (MISCELLANEOUS) ×2 IMPLANT
NEEDLE HYPO 22X1.5 SAFETY MO (MISCELLANEOUS) ×2 IMPLANT
NS IRRIG 1000ML POUR BTL (IV SOLUTION) IMPLANT
NS IRRIG 500ML POUR BTL (IV SOLUTION) ×2 IMPLANT
PACK BASIN MINOR ARMC (MISCELLANEOUS) ×2 IMPLANT
PAD OB MATERNITY 11 LF (PERSONAL CARE ITEMS) ×2 IMPLANT
PAD PREP OB/GYN DISP 24X41 (PERSONAL CARE ITEMS) ×2 IMPLANT
SCOPETTES 8 STERILE (MISCELLANEOUS) IMPLANT
SCRUB CHG 4% DYNA-HEX 4OZ (MISCELLANEOUS) ×2 IMPLANT
SOLUTION PREP PVP 2OZ (MISCELLANEOUS) IMPLANT
SURGILUBE 2OZ TUBE FLIPTOP (MISCELLANEOUS) ×2 IMPLANT
SUT VIC AB 2-0 SH 27XBRD (SUTURE) ×2 IMPLANT
SUT VIC AB 3-0 SH 27X BRD (SUTURE) ×2 IMPLANT
SUT VIC AB 4-0 SH 27XANBCTRL (SUTURE) IMPLANT
SYR CONTROL 10ML LL (SYRINGE) ×2 IMPLANT
TRAP FLUID SMOKE EVACUATOR (MISCELLANEOUS) ×2 IMPLANT
WATER STERILE IRR 500ML POUR (IV SOLUTION) ×2 IMPLANT

## 2024-05-02 NOTE — Progress Notes (Signed)
 Patient provided with peri-bottle, icepads, and ice pack, gauze and mesh brief for home use. Instructions given to patient and spouse.

## 2024-05-02 NOTE — Anesthesia Procedure Notes (Signed)
 Procedure Name: LMA Insertion Date/Time: 05/02/2024 7:36 AM  Performed by: Sherrlyn Dolores, CRNAPre-anesthesia Checklist: Patient identified and Emergency Drugs available Patient Re-evaluated:Patient Re-evaluated prior to induction Oxygen Delivery Method: Circle system utilized Preoxygenation: Pre-oxygenation with 100% oxygen Induction Type: IV induction LMA: LMA inserted LMA Size: 5.0 Number of attempts: 1 Placement Confirmation: positive ETCO2 Dental Injury: Teeth and Oropharynx as per pre-operative assessment

## 2024-05-02 NOTE — Anesthesia Preprocedure Evaluation (Signed)
 Anesthesia Evaluation  Patient identified by MRN, date of birth, ID band Patient awake    Reviewed: Allergy & Precautions, NPO status , Patient's Chart, lab work & pertinent test results  History of Anesthesia Complications (+) PONV and history of anesthetic complications  Airway Mallampati: II  TM Distance: >3 FB Neck ROM: full    Dental  (+) Chipped   Pulmonary neg pulmonary ROS, neg shortness of breath   Pulmonary exam normal        Cardiovascular Exercise Tolerance: Good (-) angina (-) Past MI and (-) DOE negative cardio ROS Normal cardiovascular exam     Neuro/Psych  Headaches  negative psych ROS   GI/Hepatic Neg liver ROS,GERD  Controlled,,  Endo/Other  negative endocrine ROS    Renal/GU      Musculoskeletal   Abdominal   Peds  Hematology negative hematology ROS (+)   Anesthesia Other Findings Past Medical History: 1999: Abnormal Pap smear of cervix     Comment:  LGSIL 2013; 2/19: BRCA negative     Comment:  BRCA neg; MyRisk neg No date: Cancer (HCC)     Comment:  rt breast No date: Complication of anesthesia     Comment:  woke up during wisdom teeth surgery No date: Family history of breast cancer     Comment:  has tested negative for HBOC in 2013/ negative Myrisk               test 2019/ lifetime risk 24.6% No date: GERD (gastroesophageal reflux disease) No date: Goiter No date: Headache     Comment:  cluster HA No date: History of chicken pox No date: Increased risk of breast cancer     Comment:  IBIS=25%, riskscore=24.6% No date: Interstitial cystitis No date: PONV (postoperative nausea and vomiting) 10/2020: Screening for colon cancer     Comment:  Cologuard NEG; repeat after 3 yrs No date: Shingles  Past Surgical History: 02/15/2024: BREAST BIOPSY; Right     Comment:  US  Bx, ribbon clip,INVASIVE MAMMARY CARCINOMA, NO               SPECIAL TYPE. - OVERALL GRADE: 1. - LYMPHOVASCULAR                INVASION: NOT IDENTIFIED. - CANCER LENGTH: 9 MM -               CALCIFICATIONS: NOT IDENTIFIED. - DUCTAL CARCINOMA IN               SITU: PRESENT, INTERMEDIATE GRADE. 02/15/2024: BREAST BIOPSY; Right     Comment:  US  RT BREAST BX W LOC DEV 1ST LESION IMG BX SPEC US                GUIDE 02/15/2024 [facility] 02/27/2024: BREAST LUMPECTOMY; Right     Comment:  Procedure: BREAST LUMPECTOMY;  Surgeon: Eldred Grego, MD;  Location: [facility];  Service: General;                Laterality: Right;  w/ RF tag 1999: COLPOSCOPY     Comment:  LGSIL Pap, mild dysplasia, VAIN 1 2005: DILATION AND CURETTAGE OF UTERUS     Comment:  due to missed abortion 06/27/2015: HYSTEROSCOPY WITH D & C; N/A     Comment:  Procedure: DILATATION AND CURETTAGE /HYSTEROSCOPY;                Surgeon: Margarie Shay Ward,  MD;  Location: [facility]S;                Service: Gynecology;  Laterality: N/A; 02/23/2024: savi tag placement; Right 02/27/2024: SENTINEL NODE BIOPSY; Right     Comment:  Procedure: BIOPSY, LYMPH NODE, SENTINEL;  Surgeon:               Eldred Grego, MD;  Location: [facility];  Service:              General;  Laterality: Right; No date: WISDOM TOOTH EXTRACTION     Comment:  woke up during extraction  BMI    Body Mass Index: 31.01 kg/m      Reproductive/Obstetrics negative OB ROS                             Anesthesia Physical Anesthesia Plan  ASA: 2  Anesthesia Plan: General LMA   Post-op Pain Management:    Induction: Intravenous  PONV Risk Score and Plan: Dexamethasone , Ondansetron , Midazolam , Treatment may vary due to age or medical condition and TIVA  Airway Management Planned: LMA  Additional Equipment:   Intra-op Plan:   Post-operative Plan: Extubation in OR  Informed Consent: I have reviewed the patients History and Physical, chart, labs and discussed the procedure including the risks, benefits and alternatives for  the proposed anesthesia with the patient or authorized representative who has indicated his/her understanding and acceptance.     Dental Advisory Given  Plan Discussed with: Anesthesiologist, CRNA and Surgeon  Anesthesia Plan Comments: (Patient consented for risks of anesthesia including but not limited to:  - adverse reactions to medications - damage to eyes, teeth, lips or other oral mucosa - nerve damage due to positioning  - sore throat or hoarseness - Damage to heart, brain, nerves, lungs, other parts of body or loss of life  Patient voiced understanding and assent.)       Anesthesia Quick Evaluation

## 2024-05-02 NOTE — Interval H&P Note (Signed)
 History and Physical Interval Note:  05/02/2024 7:24 AM  Amanda Coffey  has presented today for surgery, with the diagnosis of vulvar dysplasia.  The various methods of treatment have been discussed with the patient and family. After consideration of risks, benefits and other options for treatment, the patient has consented to  Procedure(s): EXAM UNDER ANESTHESIA, PELVIC (N/A) VULVECTOMY, PARTIAL (N/A) BIOPSY, VULVA (N/A) as a surgical intervention.  The patient's history has been reviewed, patient examined, no change in status, stable for surgery.  I have reviewed the patient's chart and labs.  Questions were answered to the patient's satisfaction.     Zeinab Rodwell [employer]

## 2024-05-02 NOTE — Transfer of Care (Signed)
 Immediate Anesthesia Transfer of Care Note  Patient: Amanda Coffey  Procedure(s) Performed: EXAM UNDER ANESTHESIA, PELVIC (Vagina ) VULVECTOMY, PARTIAL (Vulva) BIOPSY, VULVA (Vulva)  Patient Location: PACU  Anesthesia Type:General  Level of Consciousness: drowsy  Airway & Oxygen Therapy: Patient Spontanous Breathing and Patient connected to face mask oxygen  Post-op Assessment: Report given to RN, Post -op Vital signs reviewed and stable, and Patient moving all extremities  Post vital signs: Reviewed and stable  Last Vitals:  Vitals Value Taken Time  BP 109/70 05/02/24 0822  Temp    Pulse 75 05/02/24 0826  Resp 16 05/02/24 0826  SpO2 98 % 05/02/24 0826  Vitals shown include unfiled device data.  Last Pain:  Vitals:   05/02/24 0600  TempSrc: Oral  PainSc: 0-No pain         Complications: No notable events documented.

## 2024-05-02 NOTE — Op Note (Signed)
  Operative Note   01/21/2016 10:25 AM  PRE-OP DIAGNOSIS: VIN3    POST-OP DIAGNOSIS: VIN3   SURGEON: Surgeon(s) and Role:    * Olivya Sobol Iola Manila, MD - Primary  ANESTHESIA: General   PROCEDURE: Procedure(s): EXAM UNDER ANESTHESIA  VULVAR COLPOSCOPY RIGHT WIDE LOCAL EXCISION OF THE VULVA  ESTIMATED BLOOD LOSS: Minimal  DRAINS: None  TOTAL IV FLUIDS: 200 cc  SPECIMENS:  Vulvar specimen stitch at 12 o'clock  COMPLICATIONS: None  DISPOSITION: PACU - hemodynamically stable.  CONDITION: stable  INDICATIONS: VIN3  PROCEDURE IN DETAIL: FINDINGS: Vulvar lesion at 7 o'clock right vulva at least 1 cm on direct.  On colposcopic examination acetowhite epithelium was noted at that area.  It corresponded directly with the gross lesion.  There were no other abnormal vulvar findings  PROCEDURE IN DETAIL: After informed consent was obtained, the patient was taken to the operating room where anesthesia was obtained without difficulty. The patient was positioned in the dorsal lithotomy position in Rolla stirrups and her arms were carefully tucked at her sides and the usual precautions were taken.  A timeout was performed.  Colposcopy after placement of acetic acid onto the vulva was performed with the above-noted findings.  She was prepped and draped in normal sterile fashion.  The lesion was outlined with the 5 mm margin. The vulva was infiltrated with 5 cc of 0.25% Marcaine  without epinephrine . The skin was incised with a scapel circumferentially and the dermis and subcutaneous tissue was transected using cauterization. Vascular pedicles were cauterized for hemostasis. The lesion was removed entirely and a stitch was placed at 12 o'clock.   The defect was approximately 3 x 2.5 centimeters cm. The wound was irrigated copiously and the skin reapproximated with 3-0 Vicryl in a running continuous fashion. The wound was intact with sutures without tension. Hemostasis was observed.  Exparel  was  injected, approximately 4 cc to provide adequate postoperative pain control. The wound was dressed with sterile Kerlix.   The patient tolerated the procedure well.  Sponge, lap and needle counts were correct x2.  The patient was taken to recovery room in excellent condition.  Antibiotics: Given 1st or 2nd generation cephalosporin, Antibiotics given within 1 hour of the start of the procedure, Antibiotics ordered to be discontinued within 24 hours post procedure   VTE prophylaxis: was ordered perioperatively.    Nobie Batch, MD

## 2024-05-02 NOTE — Anesthesia Postprocedure Evaluation (Signed)
 Anesthesia Post Note  Patient: Amanda Coffey  Procedure(s) Performed: EXAM UNDER ANESTHESIA, PELVIC (Vagina ) VULVECTOMY, PARTIAL (Vulva) BIOPSY, VULVA (Vulva)  Patient location during evaluation: PACU Anesthesia Type: General Level of consciousness: awake and alert Pain management: pain level controlled Vital Signs Assessment: post-procedure vital signs reviewed and stable Respiratory status: spontaneous breathing, nonlabored ventilation, respiratory function stable and patient connected to nasal cannula oxygen Cardiovascular status: blood pressure returned to baseline and stable Postop Assessment: no apparent nausea or vomiting Anesthetic complications: no   No notable events documented.   Last Vitals:  Vitals:   05/02/24 0845 05/02/24 0857  BP: 110/71 122/64  Pulse: 63 (!) 55  Resp: 17 16  Temp:  (!) 36.4 C  SpO2: 96% 100%    Last Pain:  Vitals:   05/02/24 0857  TempSrc: Temporal  PainSc: 0-No pain                 Portia Brittle Dawnielle Christiana

## 2024-05-03 ENCOUNTER — Encounter: Payer: Self-pay | Admitting: Obstetrics and Gynecology

## 2024-05-14 ENCOUNTER — Telehealth: Payer: Self-pay

## 2024-05-14 ENCOUNTER — Encounter: Payer: Self-pay | Admitting: Nurse Practitioner

## 2024-05-14 ENCOUNTER — Inpatient Hospital Stay (HOSPITAL_BASED_OUTPATIENT_CLINIC_OR_DEPARTMENT_OTHER): Admitting: Nurse Practitioner

## 2024-05-14 VITALS — BP 142/81 | HR 102 | Temp 99.2°F | Resp 16 | Wt 197.0 lb

## 2024-05-14 DIAGNOSIS — N939 Abnormal uterine and vaginal bleeding, unspecified: Secondary | ICD-10-CM

## 2024-05-14 DIAGNOSIS — C50411 Malignant neoplasm of upper-outer quadrant of right female breast: Secondary | ICD-10-CM | POA: Diagnosis not present

## 2024-05-14 NOTE — Telephone Encounter (Signed)
 0950-Rn spoke with patient. Amanda Coffey agreed to see patient to assess wound. Apt scheduled at 1pm today.

## 2024-05-14 NOTE — Progress Notes (Signed)
 Symptom Management Clinic  [facility] Department of [facility]. [facility] [address] [place], [place] [address] [phone] (phone) [phone] (fax)  Patient Care Team: Patient, No Pcp Per as PCP - General (General Practice) Georgina Shasta POUR, RN as Oncology Nurse Navigator Jacobo, Evalene PARAS, MD as Consulting Physician (Oncology) Lenn Aran, MD as Consulting Physician (Radiation Oncology)   Name of the patient: Amanda Coffey  [id number]  04/18/1976   Date of visit: 05/14/24  Diagnosis- VIN3  Chief complaint/ Reason for visit- Vaginal bleeding  Heme/Onc history:  Oncology History  Invasive ductal carcinoma of right breast (HCC)  02/22/2024 Initial Diagnosis   Invasive ductal carcinoma of right breast (HCC)   03/19/2024 Cancer Staging   Staging form: Breast, AJCC 8th Edition - Pathologic stage from 03/19/2024: Stage IA (pT1b, pN0, cM0, G2, ER+, PR+, HER2-, Oncotype DX score: 17) - Signed by Jacobo Evalene PARAS, MD on 03/19/2024 Stage prefix: Initial diagnosis Multigene prognostic tests performed: Oncotype DX Recurrence score range: Greater than or equal to 11 Histologic grading system: 3 grade system     Interval history- Patient is 48 year old female who presents to [facility] for complaints of vaginal bleeding. Bleeding started yesterday and has persisted since onset. Describes as bright red blood. Last menstrual period was approximately 6 weeks ago. Does not appear consistent with other menstrual bleeding which raised concern for complication from her wide local excision. No pain or discharge. No concerns post operatively.   Review of systems- Review of Systems  Constitutional:  Negative for chills, fever and malaise/fatigue.  Gastrointestinal:  Negative for blood in stool and melena.  Psychiatric/Behavioral:  The patient is nervous/anxious.     No Known Allergies  Past Medical  History:  Diagnosis Date   Abnormal Pap smear of cervix 1999   LGSIL   BRCA negative 2013; 2/19   BRCA neg; MyRisk neg   Cancer (HCC)    rt breast   Complication of anesthesia    woke up during wisdom teeth surgery   Family history of breast cancer    has tested negative for HBOC in 2013/ negative Myrisk test 2019/ lifetime risk 24.6%   GERD (gastroesophageal reflux disease)    Goiter    Headache    cluster HA   History of chicken pox    Increased risk of breast cancer    IBIS=25%, riskscore=24.6%   Interstitial cystitis    PONV (postoperative nausea and vomiting)    Screening for colon cancer 10/2020   Cologuard NEG; repeat after 3 yrs   Shingles     Past Surgical History:  Procedure Laterality Date   BREAST BIOPSY Right 02/15/2024   US  Bx, ribbon clip,INVASIVE MAMMARY CARCINOMA, NO SPECIAL TYPE. - OVERALL GRADE: 1. - LYMPHOVASCULAR INVASION: NOT IDENTIFIED. - CANCER LENGTH: 9 MM - CALCIFICATIONS: NOT IDENTIFIED. - DUCTAL CARCINOMA IN SITU: PRESENT, INTERMEDIATE GRADE.   BREAST BIOPSY Right 02/15/2024   US  RT BREAST BX W LOC DEV 1ST LESION IMG BX SPEC US  GUIDE 02/15/2024 [facility]   BREAST LUMPECTOMY Right 02/27/2024   Procedure: BREAST LUMPECTOMY;  Surgeon: Rodolph Romano, MD;  Location: [facility] ORS;  Service: General;  Laterality: Right;  w/ RF tag   COLPOSCOPY  1999   LGSIL Pap, mild dysplasia, VAIN 1   DILATION AND CURETTAGE OF UTERUS  2005   due to missed abortion   EXAM UNDER ANESTHESIA, PELVIC N/A 05/02/2024   Procedure: EXAM UNDER  ANESTHESIA, PELVIC;  Surgeon: Elby Webb Loges, MD;  Location: [facility] ORS;  Service: Gynecology;  Laterality: N/A;   HYSTEROSCOPY WITH D & C N/A 06/27/2015   Procedure: DILATATION AND CURETTAGE /HYSTEROSCOPY;  Surgeon: Mitzie BROCKS Ward, MD;  Location: [facility];  Service: Gynecology;  Laterality: N/A;   savi tag placement Right 02/23/2024   SENTINEL NODE BIOPSY Right 02/27/2024   Procedure: BIOPSY, LYMPH NODE, SENTINEL;   Surgeon: Rodolph Romano, MD;  Location: [facility] ORS;  Service: General;  Laterality: Right;   VULVA MARYBETH BIOPSY N/A 05/02/2024   Procedure: BIOPSY, VULVA;  Surgeon: Elby Webb Loges, MD;  Location: [facility];  Service: Gynecology;  Laterality: N/A;   VULVECTOMY PARTIAL N/A 05/02/2024   Procedure: VULVECTOMY, PARTIAL;  Surgeon: Elby Webb Loges, MD;  Location: [facility];  Service: Gynecology;  Laterality: N/A;   WISDOM TOOTH EXTRACTION     woke up during extraction    Current Outpatient Medications:    ibuprofen  (ADVIL ) 200 MG tablet, Take 400 mg by mouth every 8 (eight) hours as needed for moderate pain (pain score 4-6) (headaches.)., Disp: , Rfl:    letrozole  (FEMARA ) 2.5 MG tablet, Take 1 tablet (2.5 mg total) by mouth daily., Disp: 90 tablet, Rfl: 3   levocetirizine (XYZAL) 5 MG tablet, Take 5 mg by mouth at bedtime., Disp: , Rfl:    Multiple Vitamins-Minerals (MULTIVITAMIN WITH MINERALS) tablet, Take 2 tablets by mouth in the morning., Disp: , Rfl:   Physical exam:  Vitals:   05/14/24 1300  BP: (!) 142/81  Pulse: (!) 102  Resp: 16  Temp: 99.2 F (37.3 C)  TempSrc: Tympanic  SpO2: 99%  Weight: 197 lb (89.4 kg)   Physical Exam Vitals reviewed.  Constitutional:      Appearance: She is not ill-appearing.   Neurological:     Mental Status: She is alert and oriented to person, place, and time.   Pelvic: Exam Chaperoned by CMA EGBUS: vulvar incision without evidence of infection of hematoma on right lower vulva. Single suture intact.  Speculum exam- blood in vagina and appears to be originating from cervical os. BME: deferred.   No results found.  Assessment and plan- Patient is a 48 y.o. female   Vaginal bleeding- consistent with menses. No apparent complications of her WLE to vulva. She will follow up with gyn onc as scheduled.  Disposition As scheduled- la   Visit Diagnosis 1. Vaginal bleeding     Patient expressed understanding and was in  agreement with this plan. She also understands that She can call clinic at any time with any questions, concerns, or complaints.   Thank you for allowing me to participate in the care of this very pleasant patient.   Tinnie Dawn, DNP, AGNP-C, AOCNP [facility] [phone]  CC:

## 2024-05-14 NOTE — Telephone Encounter (Signed)
 Patient called stating she started having bleeding from her wound. Past surgery vulvar dysplasia Dr. Elby on 6/11. She is asking if she needs to be seen [facility] today. If so, she is available all day. Please call her at [phone]

## 2024-05-16 ENCOUNTER — Telehealth: Payer: Self-pay | Admitting: Obstetrics and Gynecology

## 2024-05-16 NOTE — Telephone Encounter (Signed)
 I reviewed operative and pathology findings. I have reviewed options given the positive margins for dysplasia and recommended adjuvant therapy with either imiquimod or 5-FU. We will discuss the type of therapy with her at her next visit.  I answered the patient's questions accordingly. She was seen postop for an episode of vaginal bleeding that was determined to be her menses. She is otherwise doing well. She will contact us  if she has any questions or concerns.     FINAL DIAGNOSIS       1. 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 Deeper sections are reviewed.  CASE COMMENTS STAINS USED IN DIAGNOSIS: H&E H&E H&E *RECUT DEEPER X 6 LEVELS *RECUT DEEPER X 6 LEVELS *RECUT DEEPER X 6 LEVELS    CLINICAL HISTORY  SPECIMEN(S) OBTAINED 1. Vulva, biopsy, Right Stitch At 12:00  SPECIMEN COMMENTS: SPECIMEN CLINICAL INFORMATION: 1. Vulvar dysplasia    Gross Description 1. Specimen: Right vulva; received fresh and placed in formalin      Orientation: Stitch at 12 o'clock (per requisition); 12-3-6 green, 05-01-11 black      Size: 2.3 x 1.2 cm, excised to a depth of 0.2 cm      External surface: Pink-tan, moderately wrinkled skin without discrete lesions.      Cut surfaces: Tan-white, solid, and homogenous.      Block summary: The specimen is submitted entirely and sequentially from 12      o'clock to 6 o'clock.      1A: 12 o'clock tip, bisected      1B: Central      1C: 6 o'clock tip, bisected      AMG 05/02/2024  Tamira Ryland Isidor Constable, MD

## 2024-05-28 ENCOUNTER — Ambulatory Visit: Admitting: *Deleted

## 2024-05-28 ENCOUNTER — Ambulatory Visit: Admitting: Radiation Oncology

## 2024-05-30 ENCOUNTER — Ambulatory Visit

## 2024-06-25 ENCOUNTER — Inpatient Hospital Stay: Attending: Oncology | Admitting: *Deleted

## 2024-06-25 ENCOUNTER — Ambulatory Visit: Admitting: Radiation Oncology

## 2024-06-25 DIAGNOSIS — C50911 Malignant neoplasm of unspecified site of right female breast: Secondary | ICD-10-CM

## 2024-06-25 DIAGNOSIS — Z170 Estrogen receptor positive status [ER+]: Secondary | ICD-10-CM | POA: Insufficient documentation

## 2024-06-25 DIAGNOSIS — Z803 Family history of malignant neoplasm of breast: Secondary | ICD-10-CM | POA: Insufficient documentation

## 2024-06-25 DIAGNOSIS — N3010 Interstitial cystitis (chronic) without hematuria: Secondary | ICD-10-CM | POA: Insufficient documentation

## 2024-06-25 DIAGNOSIS — D252 Subserosal leiomyoma of uterus: Secondary | ICD-10-CM | POA: Insufficient documentation

## 2024-06-25 DIAGNOSIS — D071 Carcinoma in situ of vulva: Secondary | ICD-10-CM | POA: Insufficient documentation

## 2024-06-25 DIAGNOSIS — R1031 Right lower quadrant pain: Secondary | ICD-10-CM | POA: Insufficient documentation

## 2024-06-25 DIAGNOSIS — Z923 Personal history of irradiation: Secondary | ICD-10-CM | POA: Insufficient documentation

## 2024-06-25 DIAGNOSIS — B029 Zoster without complications: Secondary | ICD-10-CM | POA: Insufficient documentation

## 2024-06-25 DIAGNOSIS — Z8616 Personal history of COVID-19: Secondary | ICD-10-CM | POA: Insufficient documentation

## 2024-06-25 DIAGNOSIS — Z79811 Long term (current) use of aromatase inhibitors: Secondary | ICD-10-CM | POA: Insufficient documentation

## 2024-06-25 DIAGNOSIS — Z800 Family history of malignant neoplasm of digestive organs: Secondary | ICD-10-CM | POA: Insufficient documentation

## 2024-06-25 DIAGNOSIS — C50411 Malignant neoplasm of upper-outer quadrant of right female breast: Secondary | ICD-10-CM | POA: Insufficient documentation

## 2024-06-25 NOTE — Progress Notes (Signed)
 SUBJECTIVE: Pt returns for her 3 month L-Dex screen.   Her numbers were elevated to the yellow and she will come back in 1 month to check SOZO again.     PAIN:  Are you having pain? No   SOZO SCREENING: Patient was assessed today using the SOZO machine to determine the lymphedema index score. This was compared to her baseline score. It was determined that she is within the recommended range when compared to her baseline and no further action is needed at this time. She will continue SOZO screenings. These are done every 3 months for 2 years post operatively followed by every 6 months for 2 years, and then annually.     L-DEX FLOWSHEETS                L-DEX LYMPHEDEMA SCREENING    Measurement Type Unilateral     L-DEX MEASUREMENT EXTREMITY Upper Extremity     POSITION  Standing     DOMINANT SIDE Right     At Risk Side Right    BASELINE SCORE (UNILATERAL) 1.3    L-DEX SCORE (UNILATERAL) 9.1    VALUE CHANGE (UNILAT) 7.8

## 2024-06-26 ENCOUNTER — Ambulatory Visit
Admission: RE | Admit: 2024-06-26 | Discharge: 2024-06-26 | Disposition: A | Discharge status: Home / Self Care | Source: Ambulatory Visit | Attending: Oncology | Admitting: Oncology

## 2024-06-26 DIAGNOSIS — C50919 Malignant neoplasm of unspecified site of unspecified female breast: Secondary | ICD-10-CM | POA: Insufficient documentation

## 2024-06-27 ENCOUNTER — Inpatient Hospital Stay: Admitting: Nurse Practitioner

## 2024-06-27 ENCOUNTER — Ambulatory Visit
Admission: RE | Admit: 2024-06-27 | Discharge: 2024-06-27 | Disposition: A | Discharge status: Home / Self Care | Source: Ambulatory Visit | Attending: Radiation Oncology | Admitting: Radiation Oncology

## 2024-06-27 ENCOUNTER — Encounter: Payer: Self-pay | Admitting: Radiation Oncology

## 2024-06-27 VITALS — BP 118/79 | HR 88 | Temp 98.7°F | Resp 20 | Wt 188.0 lb

## 2024-06-27 DIAGNOSIS — Z170 Estrogen receptor positive status [ER+]: Secondary | ICD-10-CM | POA: Insufficient documentation

## 2024-06-27 DIAGNOSIS — D071 Carcinoma in situ of vulva: Secondary | ICD-10-CM

## 2024-06-27 DIAGNOSIS — C50411 Malignant neoplasm of upper-outer quadrant of right female breast: Secondary | ICD-10-CM | POA: Insufficient documentation

## 2024-06-27 DIAGNOSIS — Z923 Personal history of irradiation: Secondary | ICD-10-CM | POA: Diagnosis not present

## 2024-06-27 DIAGNOSIS — R1031 Right lower quadrant pain: Secondary | ICD-10-CM | POA: Diagnosis not present

## 2024-06-27 DIAGNOSIS — Z79811 Long term (current) use of aromatase inhibitors: Secondary | ICD-10-CM | POA: Diagnosis not present

## 2024-06-27 DIAGNOSIS — D252 Subserosal leiomyoma of uterus: Secondary | ICD-10-CM | POA: Diagnosis not present

## 2024-06-27 DIAGNOSIS — N3010 Interstitial cystitis (chronic) without hematuria: Secondary | ICD-10-CM | POA: Diagnosis not present

## 2024-06-27 DIAGNOSIS — Z803 Family history of malignant neoplasm of breast: Secondary | ICD-10-CM | POA: Diagnosis not present

## 2024-06-27 DIAGNOSIS — Z8616 Personal history of COVID-19: Secondary | ICD-10-CM | POA: Diagnosis not present

## 2024-06-27 DIAGNOSIS — B029 Zoster without complications: Secondary | ICD-10-CM | POA: Diagnosis not present

## 2024-06-27 DIAGNOSIS — Z800 Family history of malignant neoplasm of digestive organs: Secondary | ICD-10-CM | POA: Diagnosis not present

## 2024-06-27 DIAGNOSIS — C50911 Malignant neoplasm of unspecified site of right female breast: Secondary | ICD-10-CM

## 2024-06-27 MED ORDER — IMIQUIMOD 5 % EX CREA: TOPICAL_CREAM | CUTANEOUS | 1 refills | Status: AC

## 2024-06-27 NOTE — Progress Notes (Signed)
 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  She has a h/o HPV--VAIN 1/LGSIL 1999 on colpo bx with Dr. Kathe and recently diagnosed breast cancer s/p radiation.   On 05/02/24 she underwent EUA, vulvar colpo, and right WLE with Dr Elby at [facility] for [facility].   FINAL DIAGNOSIS  1. 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 Feels some pulling/tightness at times. She has completed radiation for breast cancer.     Gynecologic Oncology History:   She was noted to have a RT labial lesion treated with TCA 3/25. Area was pale/white and looked almost warty; turned white with TCA tx.   Vulvar biopsy was obtained.   03/20/2024 A. 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   Note: The skin shows full-thickness atypia with near surface mitoses but  with focally preserved granular layer and evidence of hyperkeratosis.  P53 is wild-type p16 is overall positive however not fully strong  blocklike positive.  The proliferation rate by Ki-67 shows  full-thickness positivity.  The findings are consistent with high-grade  VIN and most consistent with usual/HPV associated VIN.   B. LABIA, LEFT LABIA MINORA, SKIN TAG:  -  Fibroepithelial polyp, negative for dysplasia.   04/24/2021- TRANSABDOMINAL AND TRANSVAGINAL ULTRASOUND OF PELVIS CLINICAL DATA:  RIGHT lower quadrant abdominal pain for 1 week, irregular menses for months, LMP  04/05/2021 FINDINGS: Uterus: Measurements: 8.3 x 4.4 x 5.4 cm = volume: 105 mL. Anteverted. Nabothian cysts at cervix. subserosal leiomyoma at posterior fundus 2.2 x 1.9 x 3.1 cm. No additional masses. Endometrium: Thickness: 6 mm.  No endometrial fluid or focal abnormality Right ovary: Not visualized, likely obscured by bowel Left ovary: Measurements: 2.3 x 1.4 x 1.7 cm = volume: 2.8 mL. Small exophytic follicle; no follow-up imaging recommended. No additional mass. Other findings: No free pelvic fluid or adnexal masses.   IMPRESSION:  -Subserosal leiomyoma at posterior uterine fundus 3.1 cm greatest size. - Nonvisualization of RIGHT ovary. - Remainder of exam normal.  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   Invasive ductal carcinoma of right breast (HCC) 02/22/2024   Acute non-recurrent pansinusitis 12/17/2020   History of shingles 12/17/2020   Skin sensitivity 12/17/2020   History of COVID-19 12/17/2020   Heat intolerance 11/03/2020   Increased risk of breast cancer    Diffuse nontoxic goiter 06/07/2018   Family history of breast cancer 01/12/2018   Abnormal blood chemistry 04/20/2016   Abnormal liver enzymes 04/20/2016   Interstitial cystitis 04/20/2016   Ganglion cyst of wrist 04/20/2016   Occipital mass 04/20/2016   Herpes zoster without complication 07/08/2009    Past Medical History: Past Medical History:  Diagnosis Date   Abnormal Pap smear of cervix 1999   LGSIL   BRCA negative 2013; 2/19   BRCA neg; MyRisk neg   Cancer (HCC)    rt breast   Complication  of anesthesia    woke up during wisdom teeth surgery   Family history of breast cancer    has tested negative for HBOC in 2013/ negative Myrisk test 2019/ lifetime risk 24.6%   GERD (gastroesophageal reflux disease)    Goiter    Headache    cluster HA   History of chicken pox    Increased risk of breast cancer     IBIS=25%, riskscore=24.6%   Interstitial cystitis    PONV (postoperative nausea and vomiting)    Screening for colon cancer 10/2020   Cologuard NEG; repeat after 3 yrs   Shingles     Past Surgical History: Past Surgical History:  Procedure Laterality Date   BREAST BIOPSY Right 02/15/2024   US  Bx, ribbon clip,INVASIVE MAMMARY CARCINOMA, NO SPECIAL TYPE. - OVERALL GRADE: 1. - LYMPHOVASCULAR INVASION: NOT IDENTIFIED. - CANCER LENGTH: 9 MM - CALCIFICATIONS: NOT IDENTIFIED. - DUCTAL CARCINOMA IN SITU: PRESENT, INTERMEDIATE GRADE.   BREAST BIOPSY Right 02/15/2024   US  RT BREAST BX W LOC DEV 1ST LESION IMG BX SPEC US  GUIDE 02/15/2024 [facility]   BREAST LUMPECTOMY Right 02/27/2024   Procedure: BREAST LUMPECTOMY;  Surgeon: Rodolph Romano, MD;  Location: [facility] ORS;  Service: General;  Laterality: Right;  w/ RF tag   COLPOSCOPY  1999   LGSIL Pap, mild dysplasia, VAIN 1   DILATION AND CURETTAGE OF UTERUS  2005   due to missed abortion   EXAM UNDER ANESTHESIA, PELVIC N/A 05/02/2024   Procedure: EXAM UNDER ANESTHESIA, PELVIC;  Surgeon: Elby Webb Loges, MD;  Location: [facility] ORS;  Service: Gynecology;  Laterality: N/A;   HYSTEROSCOPY WITH D & C N/A 06/27/2015   Procedure: DILATATION AND CURETTAGE /HYSTEROSCOPY;  Surgeon: Mitzie BROCKS Ward, MD;  Location: [facility] ORS;  Service: Gynecology;  Laterality: N/A;   savi tag placement Right 02/23/2024   SENTINEL NODE BIOPSY Right 02/27/2024   Procedure: BIOPSY, LYMPH NODE, SENTINEL;  Surgeon: Rodolph Romano, MD;  Location: [facility] ORS;  Service: General;  Laterality: Right;   VULVA MARYBETH BIOPSY N/A 05/02/2024   Procedure: BIOPSY, VULVA;  Surgeon: Elby Webb Loges, MD;  Location: [facility] ORS;  Service: Gynecology;  Laterality: N/A;   VULVECTOMY PARTIAL N/A 05/02/2024   Procedure: VULVECTOMY, PARTIAL;  Surgeon: Elby Webb Loges, MD;  Location: [facility] ORS;  Service: Gynecology;  Laterality: N/A;   WISDOM TOOTH EXTRACTION     woke up  during extraction    Past Gynecologic History:  Menarche: unknown Menstrual details: she recently stopped OCPs due to breast cancer diagnosis.  Menses regular: yes - only two since stopped OCPs Last Menstrual Period: 04/03/2024 History of Abnormal pap: yes, low-grade squamous intraepithelial neoplasia (LGSIL - encompassing HPV,mild dysplasia,CIN I) 1999 Last pap: 11/19/2021 NIML/HRHPV negative Contraception: vasectomy Sexually active: yes  OB History:  OB History  Gravida Para Term Preterm AB Living  3 2 2  1 2   SAB IAB Ectopic Multiple Live Births  1    2    # Outcome Date GA Lbr Len/2nd Weight Sex Type Anes PTL Lv  3 Term 05/15/05 [redacted]w[redacted]d  10 lb 11 oz (4.848 kg) M Vag-Spont   LIV  2 Term 02/06/03 [redacted]w[redacted]d  8 lb 4 oz (3.742 kg) F Vag-Vacuum EPI  LIV     Birth Comments: VAD for terminal bradycardia  1 SAB             Family History: Family History  Problem Relation Age of Onset   Hypertension Mother    Breast cancer Mother 56  Crohn's disease Father    Scoliosis Sister    Crohn's disease Paternal Grandmother    Breast cancer Paternal Grandmother 76   CAD Paternal Grandfather        stent   Breast cancer Cousin 57       2nd cousin paternal side. died in Jul 14, 2003   Colon cancer Paternal Uncle 52   Breast cancer Cousin 30       2nd cousin on paternal side   Hypertension Maternal Aunt    Multiple myeloma Maternal Aunt     Social History: Social History   Socioeconomic History   Marital status: Married    Spouse name: Johnathan   Number of children: 2   Years of education: Not on file   Highest education level: Not on file  Occupational History   Occupation: Journalist, newspaper  Tobacco Use   Smoking status: Never   Smokeless tobacco: Never  Vaping Use   Vaping status: Never Used  Substance and Sexual Activity   Alcohol use: No   Drug use: No   Sexual activity: Yes    Partners: Male    Birth control/protection: Surgical    Comment: vasectomy  Other Topics  Concern   Not on file  Social History Narrative   Not on file   Social Drivers of Health   Financial Resource Strain: Low Risk  (02/21/2024)   Received from [facility]   Overall Financial Resource Strain (CARDIA)    Difficulty of Paying Living Expenses: Not hard at all  Food Insecurity: No Food Insecurity (02/21/2024)   Received from [facility]   Hunger Vital Sign    Within the past 12 months, you worried that your food would run out before you got the money to buy more.: Never true    Within the past 12 months, the food you bought just didn't last and you didn't have money to get more.: Never true  Transportation Needs: No Transportation Needs (02/21/2024)   Received from [facility] - Transportation    In the past 12 months, has lack of transportation kept you from medical appointments or from getting medications?: No    Lack of Transportation (Non-Medical): No  Physical Activity: Not on file  Stress: Not on file  Social Connections: Not on file  Intimate Partner Violence: Not on file    Allergies: No Known Allergies  Current Medications: Current Outpatient Medications  Medication Sig Dispense Refill   ibuprofen  (ADVIL ) 200 MG tablet Take 400 mg by mouth every 8 (eight) hours as needed for moderate pain (pain score 4-6) (headaches.).     letrozole  (FEMARA ) 2.5 MG tablet Take 1 tablet (2.5 mg total) by mouth daily. 90 tablet 3   levocetirizine (XYZAL) 5 MG tablet Take 5 mg by mouth at bedtime.     Multiple Vitamins-Minerals (MULTIVITAMIN WITH MINERALS) tablet Take 2 tablets by mouth in the morning.     No current facility-administered medications for this visit.   Review of Systems General:  no complaints Skin: no complaints Eyes: no complaints HEENT: no complaints Breasts: no complaints Pulmonary: no complaints Cardiac: no complaints Gastrointestinal: no complaints Genitourinary/Sexual: no  complaints Ob/Gyn: no complaints Musculoskeletal: no complaints Hematology: no complaints Neurologic/Psych: no complaints   Objective:  Physical Examination:  BP 118/79   Pulse 88   Temp 98.7 F (37.1 C)   Resp 20   Wt 188 lb (85.3 kg)   SpO2 100%   BMI 29.44  kg/m    ECOG Performance Status: 0 - Asymptomatic  GENERAL: Patient is a well appearing female in no acute distress HEENT:  Sclera clear. Anicteric LUNGS:  no audible coughing or wheezing EXTREMITIES:  No peripheral edema. Atraumatic. No cyanosis SKIN:  Clear with no obvious rashes or skin changes.  NEURO:  Nonfocal. Well oriented.  Appropriate affect.  Pelvic: exam chaperoned by MD EGBUS well healing surgical scar of right labia at 7:00. No apparent leukoplakia surrounding scar.  Speculum & BME: internal exam deferred.   Lab Review No labs on site today  Radiologic Imaging: No imaging on site today    Assessment:  Amanda Coffey is a 48 y.o. female diagnosed with VIN3 s/p WLE with Dr Elby at [facility] on 05/02/24 with positive margins who returns to clinic for post op exam and discussion of pathology.   Medical co-morbidities complicating care: recent breast cancer diagnosis s/p radiation Plan:   Problem List Items Addressed This Visit       Genitourinary   VIN III (vulvar intraepithelial neoplasia III) - Primary   Given that margins were positive for dysplasia, adjuvant therapy is recommended. We discussed options including imiquimod  vs 5-FU. She will apply aldara  in thin layer to vulva at bedtime every other night, leave on skin overnight, rub in until no longer visible. then remove with soap and water  in the morning. Use desitin to nontreatment surfaces toa void irritation.  Local skin reactions including redness, swelling, discomfort, at the treatment site are common.  A rest period of several days may be taken if needed to allow the skin to rest.  Treatment may resume once the reaction subsides. I will plant  ot see her back in 2 weeks to assess tolerance. She will complete 16 weeks of treatment and then after 4-6 week recovery period I will have her see Dr Elby for repeat colposcopy.   The patient's diagnosis, an outline of the further diagnostic and laboratory studies which will be required, the recommendation, and alternatives were discussed.  All questions were answered to the patient's satisfaction.  Tinnie Dawn, DNP, AGNP-C, [facility] at [facility] [phone] (clinic)  CC:  Watt Bernarda KATHEE DEVONNA [address] [place],  [place] [address] [phone]

## 2024-06-27 NOTE — Progress Notes (Signed)
 Radiation Oncology Follow up Note  Name: Amanda Coffey   Date:   06/27/2024 MRN:  [id number] DOB: [birth year]    This 48 y.o. female presents to the clinic today for 1 month follow-up status post whole breast radiation to her right breast for stage Ia (pT1b N0 M0) ER positive invasive mammary carcinoma.  REFERRING PROVIDER: No ref. provider found  HPI: Patient is a 48 year old female now out 1 month having completed whole breast radiation to her right breast for stage Ia ER positive invasive mammary carcinoma.  Seen today in routine follow-up she is doing well.  She specifically denies breast tenderness cough or bone pain..  She has been started on letrozole  tolerating that well without side effect.  COMPLICATIONS OF TREATMENT: none  FOLLOW UP COMPLIANCE: keeps appointments   PHYSICAL EXAM:  There were no vitals taken for this visit. Lungs are clear to A&P cardiac examination essentially unremarkable with regular rate and rhythm. No dominant mass or nodularity is noted in either breast in 2 positions examined. Incision is well-healed. No axillary or supraclavicular adenopathy is appreciated. Cosmetic result is excellent.  Well-developed well-nourished patient in NAD. HEENT reveals PERLA, EOMI, discs not visualized.  Oral cavity is clear. No oral mucosal lesions are identified. Neck is clear without evidence of cervical or supraclavicular adenopathy. Lungs are clear to A&P. Cardiac examination is essentially unremarkable with regular rate and rhythm without murmur rub or thrill. Abdomen is benign with no organomegaly or masses noted. Motor sensory and DTR levels are equal and symmetric in the upper and lower extremities. Cranial nerves II through XII are grossly intact. Proprioception is intact. No peripheral adenopathy or edema is identified. No motor or sensory levels are noted. Crude visual fields are within normal range.  RADIOLOGY RESULTS: No current films for review  PLAN: At the present  time patient is doing well 1 month out from whole breast radiation and pleased with her overall progress.  I have asked to see her back in 6 months for follow-up.  She continues on letrozole  without side effect.  Patient knows to call with any concerns.  I would Coffey to take this opportunity to thank you for allowing me to participate in the care of your patient.SABRA Marcey Penton, MD

## 2024-06-27 NOTE — Patient Instructions (Signed)
 Vulvar Aldara  Cream Application  INSTRUCTIONS Apply the Aldara  cream by applying thin layer topically to vulva at bedtime three times a week.  Leave on skin overnight then removed with soap and water  in the morning.  Leave on skin for 6-10 hours.   To avoid the cream from getting on nontreatment sites, you can apply Desitin or Vaseline to nontreated skin surfaces to avoid contact. We recommend handwashing before and after cream application.  Avoid using excessive amounts of cream.  Aldara  5% cream was packaged and single-use packets which contain sufficient cream to cover the area.  Apply the cream externally only.  Apply a thin layer of cream and rub into the affected area until it is no longer visible.  The application site should not be covered. Local skin reactions including redness, swelling, discomfort, at the treatment site are common.  A rest period of several days may be taken if needed to allow the skin to rest.  Treatment may resume once the reaction subsides.

## 2024-06-28 ENCOUNTER — Ambulatory Visit: Admitting: Radiation Oncology

## 2024-07-03 ENCOUNTER — Encounter: Payer: Self-pay | Admitting: Nurse Practitioner

## 2024-07-03 ENCOUNTER — Telehealth: Payer: Self-pay

## 2024-07-03 NOTE — Telephone Encounter (Signed)
 PA initiated for :  Amanda Coffey (Key: [id number]) Drug: Imiquimod  5% cream

## 2024-07-03 NOTE — Telephone Encounter (Signed)
 PA was denied, further documentation sent to [facility] of [place].

## 2024-07-03 NOTE — Progress Notes (Signed)
 Imiquimod  denies by insurance. Appeal completed with included letter.

## 2024-07-06 ENCOUNTER — Telehealth: Payer: Self-pay | Admitting: Nurse Practitioner

## 2024-07-06 NOTE — Telephone Encounter (Signed)
 Pt called and needed to r/s appt - prescription was just filled today - r/s  follow-up for 2 weeks out - [facility]

## 2024-07-12 ENCOUNTER — Ambulatory Visit: Admitting: Nurse Practitioner

## 2024-07-19 ENCOUNTER — Inpatient Hospital Stay (HOSPITAL_BASED_OUTPATIENT_CLINIC_OR_DEPARTMENT_OTHER): Admitting: Nurse Practitioner

## 2024-07-19 VITALS — BP 120/79 | HR 69 | Resp 18 | Ht 67.0 in | Wt 188.0 lb

## 2024-07-19 DIAGNOSIS — Z79899 Other long term (current) drug therapy: Secondary | ICD-10-CM | POA: Diagnosis not present

## 2024-07-19 DIAGNOSIS — D071 Carcinoma in situ of vulva: Secondary | ICD-10-CM

## 2024-07-19 NOTE — Progress Notes (Signed)
 Gynecologic Oncology Consult Visit   Referring Provider: Watt Bernarda KATHEE DEVONNA [address] 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  She has a h/o HPV--VAIN 1/LGSIL 1999 on colpo bx with Dr. Kathe and recently diagnosed breast cancer s/p radiation.   On 05/02/24 she underwent EUA, vulvar colpo, and right WLE with Dr Elby at [facility] for [facility].   FINAL DIAGNOSIS  1. 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 Feels some pulling/tightness at times. She completed radiation for breast cancer on 04/27/24.   She started using topical imiquimod  for positive margins approximately 2 weeks ago. Tolerating well.     Gynecologic Oncology History:   She was noted to have a RT labial lesion treated with TCA 3/25. Area was pale/white and looked almost warty; turned white with TCA tx.   Vulvar biopsy was obtained.   03/20/2024 A. 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   Note: The skin shows full-thickness atypia with near surface mitoses but  with focally preserved granular layer and evidence of hyperkeratosis.  P53 is wild-type p16 is overall positive however not fully strong  blocklike positive.  The proliferation rate by Ki-67 shows  full-thickness positivity.  The findings are consistent with high-grade  VIN and most consistent with usual/HPV associated VIN.   B. LABIA, LEFT LABIA MINORA, SKIN TAG:  -  Fibroepithelial polyp, negative for dysplasia.   04/24/2021- TRANSABDOMINAL AND TRANSVAGINAL ULTRASOUND OF  PELVIS CLINICAL DATA:  RIGHT lower quadrant abdominal pain for 1 week, irregular menses for months, LMP 04/05/2021 FINDINGS: Uterus: Measurements: 8.3 x 4.4 x 5.4 cm = volume: 105 mL. Anteverted. Nabothian cysts at cervix. subserosal leiomyoma at posterior fundus 2.2 x 1.9 x 3.1 cm. No additional masses. Endometrium: Thickness: 6 mm.  No endometrial fluid or focal abnormality Right ovary: Not visualized, likely obscured by bowel Left ovary: Measurements: 2.3 x 1.4 x 1.7 cm = volume: 2.8 mL. Small exophytic follicle; no follow-up imaging recommended. No additional mass. Other findings: No free pelvic fluid or adnexal masses.   IMPRESSION:  -Subserosal leiomyoma at posterior uterine fundus 3.1 cm greatest size. - Nonvisualization of RIGHT ovary. - Remainder of exam normal.  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   Invasive ductal carcinoma of right breast (HCC) 02/22/2024   Acute non-recurrent pansinusitis 12/17/2020   History of shingles 12/17/2020   Skin sensitivity 12/17/2020   History of COVID-19 12/17/2020   Heat intolerance 11/03/2020   Increased risk of breast cancer    Diffuse nontoxic goiter 06/07/2018   Family history of breast cancer 01/12/2018   Abnormal blood chemistry 04/20/2016   Abnormal liver enzymes 04/20/2016   Interstitial cystitis 04/20/2016   Ganglion cyst of wrist 04/20/2016   Occipital mass 04/20/2016   Herpes zoster without complication 07/08/2009    Past Medical History: Past Medical History:  Diagnosis Date   Abnormal Pap smear of cervix 1999   LGSIL   BRCA negative 2013; 2/19  BRCA neg; MyRisk neg   Cancer (HCC)    rt breast   Complication of anesthesia    woke up during wisdom teeth surgery   Family history of breast cancer    has tested negative for HBOC in 2013/ negative Myrisk test 2019/ lifetime risk 24.6%   GERD (gastroesophageal reflux disease)     Goiter    Headache    cluster HA   History of chicken pox    Increased risk of breast cancer    IBIS=25%, riskscore=24.6%   Interstitial cystitis    PONV (postoperative nausea and vomiting)    Screening for colon cancer 10/2020   Cologuard NEG; repeat after 3 yrs   Shingles     Past Surgical History: Past Surgical History:  Procedure Laterality Date   BREAST BIOPSY Right 02/15/2024   US  Bx, ribbon clip,INVASIVE MAMMARY CARCINOMA, NO SPECIAL TYPE. - OVERALL GRADE: 1. - LYMPHOVASCULAR INVASION: NOT IDENTIFIED. - CANCER LENGTH: 9 MM - CALCIFICATIONS: NOT IDENTIFIED. - DUCTAL CARCINOMA IN SITU: PRESENT, INTERMEDIATE GRADE.   BREAST BIOPSY Right 02/15/2024   US  RT BREAST BX W LOC DEV 1ST LESION IMG BX SPEC US  GUIDE 02/15/2024 [facility]   BREAST LUMPECTOMY Right 02/27/2024   Procedure: BREAST LUMPECTOMY;  Surgeon: Rodolph Romano, MD;  Location: [facility] ORS;  Service: General;  Laterality: Right;  w/ RF tag   COLPOSCOPY  1999   LGSIL Pap, mild dysplasia, VAIN 1   DILATION AND CURETTAGE OF UTERUS  2005   due to missed abortion   EXAM UNDER ANESTHESIA, PELVIC N/A 05/02/2024   Procedure: EXAM UNDER ANESTHESIA, PELVIC;  Surgeon: Elby Webb Loges, MD;  Location: [facility];  Service: Gynecology;  Laterality: N/A;   HYSTEROSCOPY WITH D & C N/A 06/27/2015   Procedure: DILATATION AND CURETTAGE /HYSTEROSCOPY;  Surgeon: Mitzie BROCKS Ward, MD;  Location: [facility];  Service: Gynecology;  Laterality: N/A;   savi tag placement Right 02/23/2024   SENTINEL NODE BIOPSY Right 02/27/2024   Procedure: BIOPSY, LYMPH NODE, SENTINEL;  Surgeon: Rodolph Romano, MD;  Location: [facility];  Service: General;  Laterality: Right;   VULVA MARYBETH BIOPSY N/A 05/02/2024   Procedure: BIOPSY, VULVA;  Surgeon: Elby Webb Loges, MD;  Location: [facility];  Service: Gynecology;  Laterality: N/A;   VULVECTOMY PARTIAL N/A 05/02/2024   Procedure: VULVECTOMY, PARTIAL;  Surgeon: Elby Webb Loges, MD;   Location: [facility];  Service: Gynecology;  Laterality: N/A;   WISDOM TOOTH EXTRACTION     woke up during extraction    Past Gynecologic History:  Menarche: unknown Menstrual details: she recently stopped OCPs due to breast cancer diagnosis.  Menses regular: yes - only two since stopped OCPs Last Menstrual Period: 04/03/2024 History of Abnormal pap: yes, low-grade squamous intraepithelial neoplasia (LGSIL - encompassing HPV,mild dysplasia,CIN I) 1999 Last pap: 11/19/2021 NIML/HRHPV negative Contraception: vasectomy Sexually active: yes  OB History:  OB History  Gravida Para Term Preterm AB Living  3 2 2  1 2   SAB IAB Ectopic Multiple Live Births  1    2    # Outcome Date GA Lbr Len/2nd Weight Sex Type Anes PTL Lv  3 Term 05/15/05 [redacted]w[redacted]d  10 lb 11 oz (4.848 kg) M Vag-Spont   LIV  2 Term 02/06/03 [redacted]w[redacted]d  8 lb 4 oz (3.742 kg) F Vag-Vacuum EPI  LIV     Birth Comments: VAD for terminal bradycardia  1 SAB             Family History: Family History  Problem Relation Age of Onset   Hypertension Mother    Breast cancer Mother 69   Crohn's disease Father    Scoliosis Sister    Crohn's disease Paternal Grandmother    Breast cancer Paternal Grandmother 24   CAD Paternal Grandfather        stent   Breast cancer Cousin 62       2nd cousin paternal side. died in 2003/08/05   Colon cancer Paternal Uncle 36   Breast cancer Cousin 65       2nd cousin on paternal side   Hypertension Maternal Aunt    Multiple myeloma Maternal Aunt     Social History: Social History   Socioeconomic History   Marital status: Married    Spouse name: Johnathan   Number of children: 2   Years of education: Not on file   Highest education level: Not on file  Occupational History   Occupation: Journalist, newspaper  Tobacco Use   Smoking status: Never   Smokeless tobacco: Never  Vaping Use   Vaping status: Never Used  Substance and Sexual Activity   Alcohol use: No   Drug use: No   Sexual activity: Yes     Partners: Male    Birth control/protection: Surgical    Comment: vasectomy  Other Topics Concern   Not on file  Social History Narrative   Not on file   Social Drivers of Health   Financial Resource Strain: Low Risk  (02/21/2024)   Received from [facility]   Overall Financial Resource Strain (CARDIA)    Difficulty of Paying Living Expenses: Not hard at all  Food Insecurity: No Food Insecurity (02/21/2024)   Received from [facility]   Hunger Vital Sign    Within the past 12 months, you worried that your food would run out before you got the money to buy more.: Never true    Within the past 12 months, the food you bought just didn't last and you didn't have money to get more.: Never true  Transportation Needs: No Transportation Needs (02/21/2024)   Received from [facility] - Transportation    In the past 12 months, has lack of transportation kept you from medical appointments or from getting medications?: No    Lack of Transportation (Non-Medical): No  Physical Activity: Not on file  Stress: Not on file  Social Connections: Not on file  Intimate Partner Violence: Not on file    Allergies: No Known Allergies  Current Medications: Current Outpatient Medications  Medication Sig Dispense Refill   ibuprofen  (ADVIL ) 200 MG tablet Take 400 mg by mouth every 8 (eight) hours as needed for moderate pain (pain score 4-6) (headaches.).     imiquimod  (ALDARA ) 5 % cream Apply topically 3 (three) times a week. 12 each 1   letrozole  (FEMARA ) 2.5 MG tablet Take 1 tablet (2.5 mg total) by mouth daily. 90 tablet 3   levocetirizine (XYZAL) 5 MG tablet Take 5 mg by mouth at bedtime.     Multiple Vitamins-Minerals (MULTIVITAMIN WITH MINERALS) tablet Take 2 tablets by mouth in the morning.     No current facility-administered medications for this visit.   Review of Systems General:  no complaints Skin: no complaints Eyes: no  complaints HEENT: no complaints Breasts: no complaints Pulmonary: no complaints Cardiac: no complaints Gastrointestinal: no complaints Genitourinary/Sexual: no complaints Ob/Gyn: no complaints Musculoskeletal: no complaints Hematology: no complaints Neurologic/Psych: no complaints   Objective:  Physical Examination:  BP 127/84 (BP Location: Left Arm, Patient Position: Sitting)   Pulse 81   Resp 18   SpO2 100%    ECOG Performance Status: 0 - Asymptomatic  GENERAL: Patient is a well appearing female in no acute distress LUNGS:  no audible coughing or wheezing SKIN:  Clear with no obvious rashes or skin changes.  NEURO:  Nonfocal. Well oriented.  Appropriate affect.  Pelvic: exam chaperoned by CMA EGBUS well healed surgical scar of right labia at 7:00. No apparent leukoplakia surrounding scar. Erythematous reaction to imiquimod  without ulcerations. Mild erythema of left vulva from 4-6:00.  Speculum & BME: internal exam deferred.   Lab Review No labs on site today  Radiologic Imaging: No imaging on site today    Assessment:  Amanda Coffey is a 48 y.o. female diagnosed with VIN3 s/p WLE with Dr Elby at [facility] on 05/02/24 with positive margins, now post 2 weeks of treatment who returns to clinic for tolerance check. Having good response to cream and tolerating well.    Medical co-morbidities complicating care: recent breast cancer diagnosis s/p radiation Plan:   Problem List Items Addressed This Visit   None   Given positive margins, adjuvant therapy was recommended and she elected for imiquimod . Tolerating well. Plan for 16 total weeks of treatment then recovery period of 4-6 weeks. She will return for vulvar colposcopy with Dr Elby at completion of treatment.   The patient's diagnosis, an outline of the further diagnostic and laboratory studies which will be required, the recommendation, and alternatives were discussed.  All questions were answered to the patient's  satisfaction.  Tinnie Dawn, DNP, AGNP-C, [facility] [phone] (clinic)  CC:  Watt Bernarda KATHEE DEVONNA [address] [place],  [place] [address] [phone]

## 2024-07-31 ENCOUNTER — Encounter: Payer: Self-pay | Admitting: Oncology

## 2024-07-31 ENCOUNTER — Inpatient Hospital Stay: Attending: Oncology | Admitting: Oncology

## 2024-07-31 ENCOUNTER — Other Ambulatory Visit: Payer: Self-pay | Admitting: Oncology

## 2024-07-31 ENCOUNTER — Inpatient Hospital Stay: Admitting: *Deleted

## 2024-07-31 ENCOUNTER — Other Ambulatory Visit: Payer: Self-pay | Admitting: *Deleted

## 2024-07-31 VITALS — BP 125/84 | HR 75 | Temp 97.4°F | Resp 16 | Wt 186.0 lb

## 2024-07-31 DIAGNOSIS — D071 Carcinoma in situ of vulva: Secondary | ICD-10-CM | POA: Insufficient documentation

## 2024-07-31 DIAGNOSIS — C50919 Malignant neoplasm of unspecified site of unspecified female breast: Secondary | ICD-10-CM

## 2024-07-31 DIAGNOSIS — C50911 Malignant neoplasm of unspecified site of right female breast: Secondary | ICD-10-CM | POA: Insufficient documentation

## 2024-07-31 DIAGNOSIS — Z1732 Human epidermal growth factor receptor 2 negative status: Secondary | ICD-10-CM | POA: Insufficient documentation

## 2024-07-31 DIAGNOSIS — Z9189 Other specified personal risk factors, not elsewhere classified: Secondary | ICD-10-CM

## 2024-07-31 DIAGNOSIS — Z170 Estrogen receptor positive status [ER+]: Secondary | ICD-10-CM | POA: Insufficient documentation

## 2024-07-31 DIAGNOSIS — Z79811 Long term (current) use of aromatase inhibitors: Secondary | ICD-10-CM | POA: Insufficient documentation

## 2024-07-31 DIAGNOSIS — Z923 Personal history of irradiation: Secondary | ICD-10-CM | POA: Insufficient documentation

## 2024-07-31 DIAGNOSIS — Z1721 Progesterone receptor positive status: Secondary | ICD-10-CM | POA: Insufficient documentation

## 2024-07-31 NOTE — Progress Notes (Signed)
 [facility]  Telephone:(336) [phone] Fax:(336) [phone]  ID: Shelda CHRISTELLA Ee OB: [birth year]  MR#: [id number]  RDW#:[id number]  Patient Care Team: Patient, No Pcp Per as PCP - General (General Practice) Georgina Shasta POUR, RN as Oncology Nurse Navigator Jacobo, Evalene PARAS, MD as Consulting Physician (Oncology) Lenn Aran, MD as Consulting Physician (Radiation Oncology)  CHIEF COMPLAINT: Pathologic stage Ia ER/PR positive, HER2 negative invasive carcinoma of the right breast.  Oncotype Dx score 17, low risk.  INTERVAL HISTORY: Patient returns to clinic today for routine 56-month evaluation and assess her toleration of letrozole .  She currently feels well and is asymptomatic.  She is tolerating treatment without significant side effects. She has no neurologic complaints.  She denies any recent fevers or illnesses.  She has a good appetite and denies weight loss.  She has no chest pain, shortness of breath, cough, or hemoptysis.  She denies any nausea, vomiting, constipation, or diarrhea.  She has no urinary complaints.  Patient offers no specific complaints today.  REVIEW OF SYSTEMS:   Review of Systems  Constitutional: Negative.  Negative for fever, malaise/fatigue and weight loss.  Respiratory: Negative.  Negative for cough, hemoptysis and shortness of breath.   Cardiovascular: Negative.  Negative for chest pain and leg swelling.  Gastrointestinal: Negative.  Negative for abdominal pain.  Genitourinary: Negative.  Negative for dysuria.  Musculoskeletal: Negative.  Negative for back pain.  Skin: Negative.  Negative for rash.  Neurological: Negative.  Negative for dizziness, focal weakness, weakness and headaches.  Psychiatric/Behavioral: Negative.  The patient is not nervous/anxious.     As per HPI. Otherwise, a complete review of systems is negative.  PAST MEDICAL HISTORY: Past Medical History:  Diagnosis Date   Abnormal Pap smear of cervix 1999   LGSIL   BRCA  negative 2013; 2/19   BRCA neg; MyRisk neg   Cancer (HCC)    rt breast   Complication of anesthesia    woke up during wisdom teeth surgery   Family history of breast cancer    has tested negative for HBOC in 2013/ negative Myrisk test 2019/ lifetime risk 24.6%   GERD (gastroesophageal reflux disease)    Goiter    Headache    cluster HA   History of chicken pox    Increased risk of breast cancer    IBIS=25%, riskscore=24.6%   Interstitial cystitis    PONV (postoperative nausea and vomiting)    Screening for colon cancer 10/2020   Cologuard NEG; repeat after 3 yrs   Shingles     PAST SURGICAL HISTORY: Past Surgical History:  Procedure Laterality Date   BREAST BIOPSY Right 02/15/2024   US  Bx, ribbon clip,INVASIVE MAMMARY CARCINOMA, NO SPECIAL TYPE. - OVERALL GRADE: 1. - LYMPHOVASCULAR INVASION: NOT IDENTIFIED. - CANCER LENGTH: 9 MM - CALCIFICATIONS: NOT IDENTIFIED. - DUCTAL CARCINOMA IN SITU: PRESENT, INTERMEDIATE GRADE.   BREAST BIOPSY Right 02/15/2024   US  RT BREAST BX W LOC DEV 1ST LESION IMG BX SPEC US  GUIDE 02/15/2024 [facility]   BREAST LUMPECTOMY Right 02/27/2024   Procedure: BREAST LUMPECTOMY;  Surgeon: Rodolph Romano, MD;  Location: [facility] OR[facility];  Service: General;  Laterality: Right;  w/ RF tag   COLPOSCOPY  1999   LGSIL Pap, mild dysplasia, VAIN 1   DILATION AND CURETTAGE OF UTERUS  2005   due to missed abortion   EXAM UNDER ANESTHESIA, PELVIC N/A 05/02/2024   Procedure: EXAM UNDER ANESTHESIA, PELVIC;  Surgeon: Elby Webb Loges, MD;  Location: [facility];  Service:  Gynecology;  Laterality: N/A;   HYSTEROSCOPY WITH D & C N/A 06/27/2015   Procedure: DILATATION AND CURETTAGE /HYSTEROSCOPY;  Surgeon: Mitzie BROCKS Ward, MD;  Location: [facility];  Service: Gynecology;  Laterality: N/A;   savi tag placement Right 02/23/2024   SENTINEL NODE BIOPSY Right 02/27/2024   Procedure: BIOPSY, LYMPH NODE, SENTINEL;  Surgeon: Rodolph Romano, MD;  Location: [facility];   Service: General;  Laterality: Right;   VULVA MARYBETH BIOPSY N/A 05/02/2024   Procedure: BIOPSY, VULVA;  Surgeon: Elby Webb Loges, MD;  Location: [facility];  Service: Gynecology;  Laterality: N/A;   VULVECTOMY PARTIAL N/A 05/02/2024   Procedure: VULVECTOMY, PARTIAL;  Surgeon: Elby Webb Loges, MD;  Location: [facility];  Service: Gynecology;  Laterality: N/A;   WISDOM TOOTH EXTRACTION     woke up during extraction    FAMILY HISTORY: Family History  Problem Relation Age of Onset   Hypertension Mother    Breast cancer Mother 55   Crohn's disease Father    Scoliosis Sister    Crohn's disease Paternal Grandmother    Breast cancer Paternal Grandmother 50   CAD Paternal Grandfather        stent   Breast cancer Cousin 51       2nd cousin paternal side. died in August 18, 2003   Colon cancer Paternal Uncle 1   Breast cancer Cousin 69       2nd cousin on paternal side   Hypertension Maternal Aunt    Multiple myeloma Maternal Aunt     ADVANCED DIRECTIVES (Y/N):  N  HEALTH MAINTENANCE: Social History   Tobacco Use   Smoking status: Never   Smokeless tobacco: Never  Vaping Use   Vaping status: Never Used  Substance Use Topics   Alcohol use: No   Drug use: No     Colonoscopy:  PAP:  Bone density:  Lipid panel:  No Known Allergies  Current Outpatient Medications  Medication Sig Dispense Refill   ibuprofen  (ADVIL ) 200 MG tablet Take 400 mg by mouth every 8 (eight) hours as needed for moderate pain (pain score 4-6) (headaches.).     imiquimod  (ALDARA ) 5 % cream Apply topically 3 (three) times a week. 12 each 1   letrozole  (FEMARA ) 2.5 MG tablet Take 1 tablet (2.5 mg total) by mouth daily. 90 tablet 3   levocetirizine (XYZAL) 5 MG tablet Take 5 mg by mouth at bedtime.     Multiple Vitamins-Minerals (MULTIVITAMIN WITH MINERALS) tablet Take 2 tablets by mouth in the morning.     No current facility-administered medications for this visit.    OBJECTIVE: Vitals:    07/31/24 0946  BP: 125/84  Pulse: 75  Resp: 16  Temp: (!) 97.4 F (36.3 C)  SpO2: 99%     Body mass index is 29.13 kg/m.    ECOG FS:0 - Asymptomatic  General: Well-developed, well-nourished, no acute distress. Eyes: Pink conjunctiva, anicteric sclera. HEENT: Normocephalic, moist mucous membranes. Lungs: No audible wheezing or coughing. Heart: Regular rate and rhythm. Abdomen: Soft, nontender, no obvious distention. Musculoskeletal: No edema, cyanosis, or clubbing. Neuro: Alert, answering all questions appropriately. Cranial nerves grossly intact. Skin: No rashes or petechiae noted. Psych: Normal affect.  LAB RESULTS:  Lab Results  Component Value Date   NA 140 02/10/2024   K 4.0 02/10/2024   CL 105 02/10/2024   CO2 22 02/10/2024   GLUCOSE 90 02/10/2024   BUN 13 02/10/2024   CREATININE 0.74 02/10/2024   CALCIUM 9.2 02/10/2024   PROT 6.3  02/10/2024   ALBUMIN 4.1 02/10/2024   AST 18 02/10/2024   ALT 28 02/10/2024   ALKPHOS 83 02/10/2024   BILITOT 0.4 02/10/2024   GFRNONAA 104 12/27/2016   GFRAA 120 12/27/2016    Lab Results  Component Value Date   WBC 4.3 04/18/2024   NEUTROABS 3.8 04/23/2021   HGB 14.8 04/18/2024   HCT 43.6 04/18/2024   MCV 92.4 04/18/2024   PLT 315 04/18/2024     STUDIES: No results found.   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  Oncotype Dx score 17, low risk.  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  Oncotype Dx score 17, low risk: Patient underwent lumpectomy on February 27, 2024.  Given her low risk Oncotype score she did not require adjuvant chemotherapy.  She completed adjuvant XRT on April 27, 2024.  Continue letrozole  for a total of 5 years completing treatment in June 2030.  Return to clinic in 6 months with mammogram and routine evaluation.   Bone health: Bone mineral density on June 26, 2024 was reported as  normal.  Repeat in August 2027.   Genetic testing: Negative. VIN3: Patient undergoing surgery on May 02, 2024 with Dr. Elby.  Surgical pathology reported severe squamous dysplasia, but negative for invasive carcinoma.  Patient had residual disease and now is using topical treatments 3 times a week.  She has follow-up in January 2026 with gynecology oncology.   Patient expressed understanding and was in agreement with this plan. She also understands that She can call clinic at any time with any questions, concerns, or complaints.    Cancer Staging  Invasive ductal carcinoma of right breast [facility]) Staging form: Breast, AJCC 8th Edition - Pathologic stage from 03/19/2024: Stage IA (pT1b, pN0, cM0, G2, ER+, PR+, HER2-, Oncotype DX score: 17) - Signed by Jacobo Evalene PARAS, MD on 03/19/2024 Stage prefix: Initial diagnosis Multigene prognostic tests performed: Oncotype DX Recurrence score range: Greater than or equal to 11 Histologic grading system: 3 grade system   Evalene PARAS Jacobo, MD   07/31/2024 10:12 AM

## 2024-07-31 NOTE — Progress Notes (Signed)
 SUBJECTIVE: Pt returns for her 3 month L-Dex screen.  She has had an increase in her L-Dex score and will see OT tomorrow for an assessment.    PAIN:  Are you having pain? Some soreness to right side/back.  Helped daughter move recently.   SOZO SCREENING: Patient was assessed today using the SOZO machine to determine the lymphedema index score. This was compared to her baseline score. It was determined that she is within the recommended range when compared to her baseline and no further action is needed at this time. She will continue SOZO screenings. These are done every 3 months for 2 years post operatively followed by every 6 months for 2 years, and then annually.     L-DEX FLOWSHEETS                L-DEX LYMPHEDEMA SCREENING    Measurement Type Unilateral     L-DEX MEASUREMENT EXTREMITY Upper Extremity     POSITION  Standing     DOMINANT SIDE Right     At Risk Side Right    BASELINE SCORE (UNILATERAL) 1.3    L-DEX SCORE (UNILATERAL) 8.5    VALUE CHANGE (UNILAT) 7.2

## 2024-08-01 ENCOUNTER — Ambulatory Visit: Attending: Oncology | Admitting: Occupational Therapy

## 2024-08-01 DIAGNOSIS — R293 Abnormal posture: Secondary | ICD-10-CM | POA: Diagnosis present

## 2024-08-01 NOTE — Therapy (Signed)
 OUTPATIENT OCCUPATIONAL THERAPY BREAST CANCER POSTOP VISIT/RECERT   Patient Name: Amanda Coffey MRN: [id number] DOB:1976-03-24, 48 y.o., female Today's Date: 08/01/2024  END OF SESSION:  OT End of Session - 08/01/24 1510     Visit Number 3    Number of Visits 3    Date for OT Re-Evaluation 08/01/24    OT Start Time 1350    OT Stop Time 1414    OT Time Calculation (min) 24 min    Activity Tolerance Patient tolerated treatment well    Behavior During Therapy [facility] for tasks assessed/performed          Past Medical History:  Diagnosis Date   Abnormal Pap smear of cervix 1999   LGSIL   BRCA negative 2013; 2/19   BRCA neg; MyRisk neg   Cancer (HCC)    rt breast   Complication of anesthesia    woke up during wisdom teeth surgery   Family history of breast cancer    has tested negative for HBOC in 2013/ negative Myrisk test 2019/ lifetime risk 24.6%   GERD (gastroesophageal reflux disease)    Goiter    Headache    cluster HA   History of chicken pox    Increased risk of breast cancer    IBIS=25%, riskscore=24.6%   Interstitial cystitis    PONV (postoperative nausea and vomiting)    Screening for colon cancer 10/2020   Cologuard NEG; repeat after 3 yrs   Shingles    Past Surgical History:  Procedure Laterality Date   BREAST BIOPSY Right 02/15/2024   US  Bx, ribbon clip,INVASIVE MAMMARY CARCINOMA, NO SPECIAL TYPE. - OVERALL GRADE: 1. - LYMPHOVASCULAR INVASION: NOT IDENTIFIED. - CANCER LENGTH: 9 MM - CALCIFICATIONS: NOT IDENTIFIED. - DUCTAL CARCINOMA IN SITU: PRESENT, INTERMEDIATE GRADE.   BREAST BIOPSY Right 02/15/2024   US  RT BREAST BX W LOC DEV 1ST LESION IMG BX SPEC US  GUIDE 02/15/2024 [facility]   BREAST LUMPECTOMY Right 02/27/2024   Procedure: BREAST LUMPECTOMY;  Surgeon: Rodolph Romano, MD;  Location: [facility];  Service: General;  Laterality: Right;  w/ RF tag   COLPOSCOPY  1999   LGSIL Pap, mild dysplasia, VAIN 1   DILATION AND CURETTAGE OF UTERUS   2005   due to missed abortion   EXAM UNDER ANESTHESIA, PELVIC N/A 05/02/2024   Procedure: EXAM UNDER ANESTHESIA, PELVIC;  Surgeon: Elby Webb Loges, MD;  Location: [facility];  Service: Gynecology;  Laterality: N/A;   HYSTEROSCOPY WITH D & C N/A 06/27/2015   Procedure: DILATATION AND CURETTAGE /HYSTEROSCOPY;  Surgeon: Mitzie BROCKS Ward, MD;  Location: [facility];  Service: Gynecology;  Laterality: N/A;   savi tag placement Right 02/23/2024   SENTINEL NODE BIOPSY Right 02/27/2024   Procedure: BIOPSY, LYMPH NODE, SENTINEL;  Surgeon: Rodolph Romano, MD;  Location: [facility];  Service: General;  Laterality: Right;   VULVA MARYBETH BIOPSY N/A 05/02/2024   Procedure: BIOPSY, VULVA;  Surgeon: Elby Webb Loges, MD;  Location: [facility];  Service: Gynecology;  Laterality: N/A;   VULVECTOMY PARTIAL N/A 05/02/2024   Procedure: VULVECTOMY, PARTIAL;  Surgeon: Elby Webb Loges, MD;  Location: [facility];  Service: Gynecology;  Laterality: N/A;   WISDOM TOOTH EXTRACTION     woke up during extraction   Patient Active Problem List   Diagnosis Date Noted   VIN III (vulvar intraepithelial neoplasia III) 05/02/2024   Invasive ductal carcinoma of right breast (HCC) 02/22/2024   Acute non-recurrent pansinusitis 12/17/2020   History of shingles 12/17/2020  Skin sensitivity 12/17/2020   History of COVID-19 12/17/2020   Heat intolerance 11/03/2020   Increased risk of breast cancer    Diffuse nontoxic goiter 06/07/2018   Family history of breast cancer 01/12/2018   Abnormal blood chemistry 04/20/2016   Abnormal liver enzymes 04/20/2016   Interstitial cystitis 04/20/2016   Ganglion cyst of wrist 04/20/2016   Occipital mass 04/20/2016   Herpes zoster without complication 07/08/2009    PCP: [facility]  REFERRING PROVIDER: Dr Jacobo  REFERRING DIAG: R breast Cancer  THERAPY DIAG:  Abnormal posture  Rationale for Evaluation and Treatment: Rehabilitation  ONSET DATE:  02/15/24  SUBJECTIVE:                                                                                                                                                                                           SUBJECTIVE STATEMENT: The breast navigator did my Sozo which showed increased numbers.  When I was moving my daughter in August and I pulled a muscle in my right upper back.  And then have been painting a lot.  States sore but come and goes.  But I do not feel any swelling my breast and normal arm. PERTINENT HISTORY:  Patient was diagnosed with right  breast cancer - had  R lumpectomy with 1 lymph node that was negative by Dr Cesar on 02/27/24 and plan is radiation no chemo. PATIENT GOALS:   reduce lymphedema risk and learn post op HEP.   PAIN:  Are you having pain?  Slight pull over lumpectomy scar with endrange shoulder flexion abduction on the right  PRECAUTIONS: Active CA     HAND DOMINANCE: right  WEIGHT BEARING RESTRICTIONS: No  FALLS:  Has patient fallen in last 6 months? 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  Right shoulder flexion 170 degrees and abduction 160 degrees with a slight pull over lumpectomy scar.  Less than 2/10 pain  A/PROM RIGHT   eval   Shoulder extension WNL  Shoulder flexion WNL  Shoulder abduction WNL  Shoulder internal rotation WNL  Shoulder external rotation WNL    (Blank rows = not tested)  A/PROM LEFT   eval  Shoulder extension WNL  Shoulder flexion WNL  Shoulder abduction WNL  Shoulder internal rotation WNL   Shoulder external rotation WNL    (  Blank rows = not tested)  CERVICAL  AROM: All within normal limits:   UPPER EXTREMITY STRENGTH: Shoulder flexion and abduction 5/5 strength.  External and internal rotation 4+/5  LYMPHEDEMA ASSESSMENTS:   LANDMARK RIGHT   eval R 08/01/24  10 cm proximal to olecranon process 31 15cm-32.5 10cm - 30.4  Olecranon process 26.4 26  10  cm proximal to ulnar styloid process 21.2 15cm - 25.2 10cm 21   Just proximal to ulnar styloid process  16  Across hand at thumb web space    At base of 2nd digit    (Blank rows = not tested)  LANDMARK LEFT   eval L 08/01/24  10 cm proximal to olecranon process 30.5 15cm - 31 10cm-29.2  Olecranon process 26.5 26  10  cm proximal to ulnar styloid process 21.5 15cm-25 10cm - 21  Just proximal to ulnar styloid process  16 cm  Across hand at thumb web space    At base of 2nd digit    (Blank rows = not tested)  L-DEX LYMPHEDEMA SCREENING:  The patient was assessed using the L-Dex machine today to produce a lymphedema index baseline score. The patient will be reassessed on a regular basis (typically every 3 months) to obtain new L-Dex scores. If the score is > 6.5 points away from his/her baseline score indicating onset of subclinical lymphedema, it will be recommended to wear a compression garment for 4 weeks, 12 hours per day and then be reassessed. If the score continues to be > 6.5 points from baseline at reassessment, we will initiate lymphedema treatment. Assessing in this manner has a 95% rate of preventing clinically significant lymphedema.   L-DEX FLOWSHEETS - 08/01/24 1500       L-DEX LYMPHEDEMA SCREENING   Measurement Type Unilateral    L-DEX MEASUREMENT EXTREMITY Upper Extremity    POSITION  Standing    DOMINANT SIDE Right    At Risk Side Right    BASELINE SCORE (UNILATERAL) 1.3    L-DEX SCORE (UNILATERAL) 5    VALUE CHANGE (UNILAT) 3.7         Today's treatment session: 03/14/24 Assess patient's active range of motion for right shoulder flexion abduction external  rotation. Great progress with patient being just over 2 weeks postop. Reminded patient to keep pain or pull at a 1-2/10 discomfort. Change patient's home program to active assisted range of motion on the wall for shoulder flexion and abduction 10 reps to 3 times a day. Patient feels slight pull at endrange for abduction. Patient is supine able to get into radiation position.  With no discomfort or pull. Circumference about the same than at evaluation. Patient do have a little bit of postop swelling at lumpectomy scar but no seroma noticed. Done L-Dex score.  Within normal limits. Did provide patient with a handout on lymphedema for signs and symptoms as well as precautions and prevention.   SESSION 08/01/24:  Patient arrived after being referred back by breast navigator with patient with increased L-Dex score last 2 readings. Patient reports she did loose about 12 pounds since last seen by this OT. Bilateral circumference of upper extremity decreased.  Patient's upper arm on the right dominant hand increased by 1.5 and 1.2 cm.  But patient is right-hand dominant. Patient did pull a muscle in the upper right back moving her daughter first week of August into college. Patient also has been doing a lot of painting in her kitchen with repetitive activity. Upon repeat of L-Dex score patient  within normal range -no intervention needed at this time. Will recommend for patient to follow-up with a repeat in a month after which if normal reading can repeat every 3 months. Did encourage patient to get on exercise program for endurance, strength and flexibility.  At least 3 times a week.  PATIENT EDUCATION:  Education details: Lymphedema risk reduction will be done after surgery and post op shoulder/posture HEP done Person educated: Patient Education method: Explanation, Demonstration, Handout Education comprehension: Patient verbalized understanding and returned  demonstration   ASSESSMENT:  CLINICAL IMPRESSION: Patient is following up with me after having right lumpectomy with 1 lymph node removed on 02/27/2024 by Dr. Cesar.  Patient finished radiation since seen last.  Patient was followed by breast navigator that had to increase measurements on Sozo L-Dex score.  Patient did pull a muscle in the right upper back as well as doing repetitive task and painting her kitchen.  Circumference increased by 1.2 to 1.5 cm in the right upper arm.  But upon assessment patient L-Dex score within normal range this date.  Patient can follow-up with breast navigator to repeat her L-Dex score in a month after which if it is in normal range she can screen every 3 months for 2 years to detect subclinical lymphedema.  Pt will benefit from skilled therapeutic intervention to improve on the following deficits: Decreased knowledge of precautions and lymphedema education, impaired UE functional use, pain, decreased ROM, postural dysfunction.   OT treatment/interventions: ADL/self-care home management, pt/family education, therapeutic exercise,manual therapy  REHAB POTENTIAL: Good  CLINICAL DECISION MAKING: Stable/uncomplicated  EVALUATION COMPLEXITY: Low   GOALS: Goals reviewed with patient? YES  LONG TERM GOALS: (STG=LTG)    Name Target Date Goal status  1 Pt will be able to verbalize understanding of pertinent lymphedema risk reduction practices relevant to her dx specifically related to skin care.  Baseline:  No knowledge 6 to 12 weeks Met  2 Pt will be able to return demo and/or verbalize understanding of the post op HEP related to regaining shoulder ROM. Baseline:  No knowledge Today Met       4 Pt will demo she has regained full shoulder ROM and function post operatively compared to baselines.  Baseline: See objective measurements taken today. 6 to 12 weeks Met    PLAN:  OT FREQUENCY/DURATION: 1 visit in 1 week  PLAN FOR NEXT SESSION: will reassess 2-3  weeks post op to determine needs.   Patient will follow up at outpatient cancer rehab 2-3 weeks following surgery.  If the patient requires occupational therapy at that time, a specific plan will be dictated and sent to the referring physician for approval. T Occupational Therapy Information for After Breast Cancer Surgery/Treatment:  Lymphedema is a swelling condition that you may be at risk for in your arm if you have lymph nodes removed from the armpit area.  After a sentinel node biopsy, the risk is approximately 5-9% and is higher after an axillary node dissection.  There is treatment available for this condition and it is not life-threatening.  Contact your physician or occupational therapist with concerns. You may begin the 4 shoulder/posture exercises (see additional sheet) when permitted by your physician (typically a week after surgery).  If you have drains, you may need to wait until those are removed before beginning range of motion exercises.  A general recommendation is to not lift your arms above shoulder height until drains are removed.  These exercises should be done to your tolerance and  gently.  This is not a no pain/no gain type of recovery so listen to your body and stretch into the range of motion that you can tolerate, stopping if you have pain.  If you are having immediate reconstruction, ask your plastic surgeon about doing exercises as he or she may want you to wait. .  While undergoing any medical procedure or treatment, try to avoid blood pressure being taken or needle sticks from occurring on the arm on the side of cancer.   This recommendation begins after surgery and continues for the rest of your life.  This may help reduce your risk of getting lymphedema (swelling in your arm). An excellent resource for those seeking information on lymphedema is the National Lymphedema Network's web site. It can be accessed at www.lymphnet.org If you notice swelling in your hand, arm or  breast at any time following surgery (even if it is many years from now), please contact your doctor or occupational therapist to discuss this.  Lymphedema can be treated at any time but it is easier for you if it is treated early on.  If you feel like your shoulder motion is not returning to normal in a reasonable amount of time, please contact your surgeon or occupational therapist.  [facility] [phone]. [address], [place], [place] [address]       Ancel Peters, OTR/L,CLT 08/01/2024, 3:12 PM

## 2024-08-27 ENCOUNTER — Inpatient Hospital Stay: Attending: Oncology | Admitting: *Deleted

## 2024-08-27 DIAGNOSIS — Z9189 Other specified personal risk factors, not elsewhere classified: Secondary | ICD-10-CM

## 2024-08-27 DIAGNOSIS — C50911 Malignant neoplasm of unspecified site of right female breast: Secondary | ICD-10-CM

## 2024-08-27 NOTE — Progress Notes (Unsigned)
 SUBJECTIVE: Pt returns for her 3 month L-Dex screen.  The score is up 7.3 from baseline.   Discussed with OT Maureen.   PAIN:  Are you having pain? No   SOZO SCREENING: Patient was assessed today using the SOZO machine to determine the lymphedema index score. This was compared to her baseline score. It was determined that she is within the recommended range when compared to her baseline and no further action is needed at this time. She will continue SOZO screenings. These are done every 3 months for 2 years post operatively followed by every 6 months for 2 years, and then annually.     L-DEX FLOWSHEETS                L-DEX LYMPHEDEMA SCREENING    Measurement Type Unilateral     L-DEX MEASUREMENT EXTREMITY Upper Extremity     POSITION  Standing     DOMINANT SIDE Right     At Risk Side Right    BASELINE SCORE (UNILATERAL) 1.3    L-DEX SCORE (UNILATERAL) 8.6    VALUE CHANGE (UNILAT) 7.3

## 2024-10-01 ENCOUNTER — Inpatient Hospital Stay: Attending: Oncology | Admitting: *Deleted

## 2024-10-01 ENCOUNTER — Encounter: Payer: Self-pay | Admitting: *Deleted

## 2024-10-01 DIAGNOSIS — C50919 Malignant neoplasm of unspecified site of unspecified female breast: Secondary | ICD-10-CM

## 2024-10-01 DIAGNOSIS — Z9189 Other specified personal risk factors, not elsewhere classified: Secondary | ICD-10-CM

## 2024-10-01 NOTE — Progress Notes (Signed)
 SUBJECTIVE: Pt returns for her 3 month L-Dex screen.    PAIN:  Are you having pain? No   SOZO SCREENING: Patient was assessed today using the SOZO machine to determine the lymphedema index score. This was compared to her baseline score. It was determined that she is within the recommended range when compared to her baseline and no further action is needed at this time. She will continue SOZO screenings. These are done every 3 months for 2 years post operatively followed by every 6 months for 2 years, and then annually.     L-DEX FLOWSHEETS                L-DEX LYMPHEDEMA SCREENING    Measurement Type Unilateral     L-DEX MEASUREMENT EXTREMITY Upper Extremity     POSITION  Standing     DOMINANT SIDE Right     At Risk Side Right    BASELINE SCORE (UNILATERAL) 1.3    L-DEX SCORE (UNILATERAL) 7.6    VALUE CHANGE (UNILAT) 6.3

## 2024-10-01 NOTE — Progress Notes (Deleted)
 SUBJECTIVE: Pt returns for her 3 month L-Dex screen.    PAIN:  Are you having pain? No   SOZO SCREENING: Patient was assessed today using the SOZO machine to determine the lymphedema index score. This was compared to her baseline score. It was determined that she is within the recommended range when compared to her baseline and no further action is needed at this time. She will continue SOZO screenings. These are done every 3 months for 2 years post operatively followed by every 6 months for 2 years, and then annually.     L-DEX FLOWSHEETS                L-DEX LYMPHEDEMA SCREENING    Measurement Type Unilateral     L-DEX MEASUREMENT EXTREMITY Upper Extremity     POSITION  Standing     DOMINANT SIDE Right     At Risk Side Right    BASELINE SCORE (UNILATERAL) 1.3    L-DEX SCORE (UNILATERAL) 7.6    VALUE CHANGE (UNILAT) 6.3

## 2024-10-04 ENCOUNTER — Ambulatory Visit: Admitting: *Deleted

## 2024-11-21 ENCOUNTER — Ambulatory Visit

## 2024-11-28 ENCOUNTER — Inpatient Hospital Stay: Attending: Oncology | Admitting: Obstetrics and Gynecology

## 2024-11-28 VITALS — BP 140/88 | HR 86 | Temp 99.2°F | Ht 67.0 in | Wt 192.0 lb

## 2024-11-28 DIAGNOSIS — Z923 Personal history of irradiation: Secondary | ICD-10-CM | POA: Insufficient documentation

## 2024-11-28 DIAGNOSIS — D071 Carcinoma in situ of vulva: Secondary | ICD-10-CM | POA: Insufficient documentation

## 2024-11-28 DIAGNOSIS — C50919 Malignant neoplasm of unspecified site of unspecified female breast: Secondary | ICD-10-CM | POA: Diagnosis not present

## 2024-11-28 DIAGNOSIS — Z79899 Other long term (current) drug therapy: Secondary | ICD-10-CM | POA: Insufficient documentation

## 2024-11-28 DIAGNOSIS — Z79811 Long term (current) use of aromatase inhibitors: Secondary | ICD-10-CM | POA: Diagnosis not present

## 2024-11-28 NOTE — Progress Notes (Signed)
 Survivorship Care Plan visit completed.  Treatment summary reviewed and given to patient.  ASCO answers booklet reviewed and given to patient.  CARE program and Cancer Transitions discussed with patient along with other resources cancer center offers to patients and caregivers.  Patient verbalized understanding.

## 2024-11-28 NOTE — Progress Notes (Signed)
 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 Positive margins and elected for topical therapy with imiquimod , started 06/2024, completed 16 weeks of treatment and returns to clinic for vulvar colposcopy. She comlpeted her last dose of aldara  on 10/26/24.   She has a h/o HPV--VAIN 1/LGSIL 1999 on colpo bx with Dr. Kathe and history of stage Ia breast cancer s/p lumpectomy, radiation, and now on aromatase inhibitor which is managed by Dr Jacobo.   She tolerated imiquimod  well without significant side effects and no treatment breaks.  She presents for vulvar colposcopy.   Gynecologic Oncology History:   She was noted to have a RT labial lesion treated with TCA 3/25. Area was pale/white and looked almost warty; turned white with TCA tx.   Vulvar biopsy was obtained.   03/20/2024 A. 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   Note: The skin shows full-thickness atypia with near surface mitoses but with focally preserved granular layer and evidence of hyperkeratosis. P53 is wild-type p16 is overall positive however not fully strong block like positive.  The proliferation rate by Ki-67 shows full-thickness positivity.  The findings are consistent with high-grade VIN and most consistent with usual/HPV associated VIN.   B. LABIA, LEFT LABIA MINORA, SKIN TAG:  -  Fibroepithelial polyp, negative for dysplasia.   04/24/2021- TRANSABDOMINAL AND TRANSVAGINAL ULTRASOUND OF PELVIS CLINICAL DATA:  RIGHT lower quadrant abdominal pain for 1 week, irregular menses for months, LMP 04/05/2021 FINDINGS: Uterus: Measurements: 8.3 x 4.4 x 5.4 cm =  volume: 105 mL. Anteverted. Nabothian cysts at cervix. subserosal leiomyoma at posterior fundus 2.2 x 1.9 x 3.1 cm. No additional masses. Endometrium: Thickness: 6 mm.  No endometrial fluid or focal abnormality Right ovary: Not visualized, likely obscured by bowel Left ovary: Measurements: 2.3 x 1.4 x 1.7 cm = volume: 2.8 mL. Small exophytic follicle; no follow-up imaging recommended. No additional mass. Other findings: No free pelvic fluid or adnexal masses.   IMPRESSION:  -Subserosal leiomyoma at posterior uterine fundus 3.1 cm greatest size. - Nonvisualization of RIGHT ovary. - Remainder of exam normal.  05/02/24 she underwent EUA, vulvar colpo, and right WLE with Dr Elby at [facility] for VIN3.  1. 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, VAIN 3) - NEGATIVE FOR INVASIVE CARCINOMA - HSIL PRESENT IN 6:00 TIP (LATERAL/SIDE MARGINS NARROWLY FREE, 0.5 MM) - LSIL PRESENT IN 12-3-6:00 MARGIN AND 12:00 TIP    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   Invasive ductal carcinoma of right breast (HCC) 02/22/2024   Acute non-recurrent pansinusitis 12/17/2020   History of shingles 12/17/2020   Skin sensitivity 12/17/2020   History of COVID-19 12/17/2020   Heat intolerance 11/03/2020   Increased risk of breast cancer    Diffuse nontoxic goiter 06/07/2018   Family history of breast cancer 01/12/2018   Abnormal blood chemistry 04/20/2016   Abnormal liver enzymes 04/20/2016   Interstitial cystitis 04/20/2016   Ganglion cyst of wrist 04/20/2016   Occipital mass 04/20/2016   Herpes zoster without complication 07/08/2009  Past Medical History: Past Medical History:  Diagnosis Date   Abnormal Pap smear of cervix 1999   LGSIL   BRCA negative 2013; 2/19   BRCA neg; MyRisk neg   Cancer (HCC)    rt breast   Complication of anesthesia    woke up during  wisdom teeth surgery   Family history of breast cancer    has tested negative for HBOC in 2013/ negative Myrisk test 2019/ lifetime risk 24.6%   GERD (gastroesophageal reflux disease)    Goiter    Headache    cluster HA   History of chicken pox    Increased risk of breast cancer    IBIS=25%, riskscore=24.6%   Interstitial cystitis    PONV (postoperative nausea and vomiting)    Screening for colon cancer 10/2020   Cologuard NEG; repeat after 3 yrs   Shingles     Past Surgical History: Past Surgical History:  Procedure Laterality Date   BREAST BIOPSY Right 02/15/2024   US  Bx, ribbon clip,INVASIVE MAMMARY CARCINOMA, NO SPECIAL TYPE. - OVERALL GRADE: 1. - LYMPHOVASCULAR INVASION: NOT IDENTIFIED. - CANCER LENGTH: 9 MM - CALCIFICATIONS: NOT IDENTIFIED. - DUCTAL CARCINOMA IN SITU: PRESENT, INTERMEDIATE GRADE.   BREAST BIOPSY Right 02/15/2024   US  RT BREAST BX W LOC DEV 1ST LESION IMG BX SPEC US  GUIDE 02/15/2024 [facility]   BREAST LUMPECTOMY Right 02/27/2024   Procedure: BREAST LUMPECTOMY;  Surgeon: Rodolph Romano, MD;  Location: [facility] ORS;  Service: General;  Laterality: Right;  w/ RF tag   COLPOSCOPY  1999   LGSIL Pap, mild dysplasia, VAIN 1   DILATION AND CURETTAGE OF UTERUS  2005   due to missed abortion   EXAM UNDER ANESTHESIA, PELVIC N/A 05/02/2024   Procedure: EXAM UNDER ANESTHESIA, PELVIC;  Surgeon: Elby Webb Loges, MD;  Location: [facility] ORS;  Service: Gynecology;  Laterality: N/A;   HYSTEROSCOPY WITH D & C N/A 06/27/2015   Procedure: DILATATION AND CURETTAGE /HYSTEROSCOPY;  Surgeon: Mitzie BROCKS Ward, MD;  Location: [facility] ORS;  Service: Gynecology;  Laterality: N/A;   savi tag placement Right 02/23/2024   SENTINEL NODE BIOPSY Right 02/27/2024   Procedure: BIOPSY, LYMPH NODE, SENTINEL;  Surgeon: Rodolph Romano, MD;  Location: [facility] ORS;  Service: General;  Laterality: Right;   VULVA MARYBETH BIOPSY N/A 05/02/2024   Procedure: BIOPSY, VULVA;  Surgeon: Elby Webb Loges, MD;  Location: [facility] ORS;  Service: Gynecology;  Laterality: N/A;   VULVECTOMY PARTIAL N/A 05/02/2024   Procedure: VULVECTOMY, PARTIAL;  Surgeon: Elby Webb Loges, MD;  Location: [facility] ORS;  Service: Gynecology;  Laterality: N/A;   WISDOM TOOTH EXTRACTION     woke up during extraction    Past Gynecologic History:  Menarche: unknown Menstrual details: she recently stopped OCPs due to breast cancer diagnosis.  Menses regular: yes - only two since stopped OCPs Last Menstrual Period: 04/03/2024 History of Abnormal pap: yes, low-grade squamous intraepithelial neoplasia (LGSIL - encompassing HPV,mild dysplasia,CIN I) 1999 Last pap: 11/19/2021 NILM/HRHPV negative Contraception: vasectomy Sexually active: yes  OB History:  OB History  Gravida Para Term Preterm AB Living  3 2 2  1 2   SAB IAB Ectopic Multiple Live Births  1    2    # Outcome Date GA Lbr Len/2nd Weight Sex Type Anes PTL Lv  3 Term 05/15/05 [redacted]w[redacted]d  10 lb 11 oz (4.848 kg) M Vag-Spont   LIV  2 Term 02/06/03 [redacted]w[redacted]d  8 lb 4 oz (3.742 kg) F Vag-Vacuum EPI  LIV  Birth Comments: VAD for terminal bradycardia  1 SAB             Family History: Family History  Problem Relation Age of Onset   Hypertension Mother    Breast cancer Mother 62   Crohn's disease Father    Scoliosis Sister    Crohn's disease Paternal Grandmother    Breast cancer Paternal Grandmother 37   CAD Paternal Grandfather        stent   Breast cancer Cousin 5       2nd cousin paternal side. died in 12-04-02   Colon cancer Paternal Uncle 83   Breast cancer Cousin 42       2nd cousin on paternal side   Hypertension Maternal Aunt    Multiple myeloma Maternal Aunt     Social History: Social History   Socioeconomic History   Marital status: Married    Spouse name: Johnathan   Number of children: 2   Years of education: Not on file   Highest education level: Not on file  Occupational History   Occupation: Journalist, newspaper  Tobacco  Use   Smoking status: Never   Smokeless tobacco: Never  Vaping Use   Vaping status: Never Used  Substance and Sexual Activity   Alcohol use: No   Drug use: No   Sexual activity: Yes    Partners: Male    Birth control/protection: Surgical    Comment: vasectomy  Other Topics Concern   Not on file  Social History Narrative   Not on file   Social Drivers of Health   Tobacco Use: Low Risk (11/28/2024)   Patient History    Smoking Tobacco Use: Never    Smokeless Tobacco Use: Never    Passive Exposure: Not on file  Financial Resource Strain: Low Risk  (02/21/2024)   Received from [facility]   Overall Financial Resource Strain (CARDIA)    Difficulty of Paying Living Expenses: Not hard at all  Food Insecurity: No Food Insecurity (02/21/2024)   Received from [facility]   Epic    Within the past 12 months, you worried that your food would run out before you got the money to buy more.: Never true    Within the past 12 months, the food you bought just didn't last and you didn't have money to get more.: Never true  Transportation Needs: No Transportation Needs (02/21/2024)   [facility] - [facility]    In the past 12 months, has lack of transportation kept you from medical appointments or from getting medications?: No    Lack of Transportation (Non-Medical): No  Physical Activity: Not on file  Stress: Not on file  Social Connections: Not on file  Intimate Partner Violence: Not on file  Depression (PHQ2-9): Low Risk (11/28/2024)   Depression (PHQ2-9)    PHQ-2 Score: 0  Alcohol Screen: Not on file  Housing: Unknown (02/21/2024)   Received from [facility]   Epic    In the last 12 months, was there a time when you were not able to pay the mortgage or rent on time?: No    Number of Times Moved in the Last Year: Not on file    At any time in the past 12 months, were you homeless or living in a  shelter (including now)?: No  Utilities: Not At Risk (02/21/2024)   Received from [facility]   [facility] Utilities  Threatened with loss of utilities: No  Health Literacy: Not on file    Allergies: No Known Allergies  Current Medications: Current Outpatient Medications  Medication Sig Dispense Refill   ibuprofen  (ADVIL ) 200 MG tablet Take 400 mg by mouth every 8 (eight) hours as needed for moderate pain (pain score 4-6) (headaches.).     letrozole  (FEMARA ) 2.5 MG tablet Take 1 tablet (2.5 mg total) by mouth daily. 90 tablet 3   levocetirizine (XYZAL) 5 MG tablet Take 5 mg by mouth at bedtime.     Multiple Vitamins-Minerals (MULTIVITAMIN WITH MINERALS) tablet Take 2 tablets by mouth in the morning.     imiquimod  (ALDARA ) 5 % cream Apply topically 3 (three) times a week. (Patient not taking: Reported on 11/28/2024) 12 each 1   No current facility-administered medications for this visit.   Review of Systems General:  no complaints Skin: no complaints Eyes: no complaints HEENT: no complaints Breasts: no complaints Pulmonary: no complaints Cardiac: no complaints Gastrointestinal: no complaints Genitourinary/Sexual: no complaints Ob/Gyn: no complaints Musculoskeletal: no complaints Hematology: no complaints Neurologic/Psych: no complaints   Objective:  Physical Examination:  BP (!) 140/88 (BP Location: Left Arm, Patient Position: Sitting)   Pulse 86   Temp 99.2 F (37.3 C) (Tympanic)   Ht 5' 7 (1.702 m)   Wt 192 lb (87.1 kg)   LMP 07/26/2024   SpO2 99%   BMI 30.07 kg/m    ECOG Performance Status: 0 - Asymptomatic  GENERAL: Patient is a well appearing female in no acute distress NODES:  No inguinal lymphadenopathy palpated.  ABDOMEN:  Soft, nontender.   EXTREMITIES:  No peripheral edema.   NEURO:  Nonfocal. Well oriented.  Appropriate affect.  Pelvic: exam chaperoned by NP EGBUS well healed surgical scar of right labia at 7:00. No apparent leukoplakia  surrounding scar. Slight abnormal pigmentation at 6:00. Not raised.  Vagina: normal without discharge Cervix: normal cervix without lesions, polyps or tenderness Uterus: normal size, shape, consistency, no mass or tenderness BME: adnexa normal in size without mass or tenderness Anal: no external abnormal lesions noted  Vulvar colposcopy and biopsy: Patient given informed consent, signed copy in the chart, time out was performed.  Placed in lithotomy position. Vulva examined with colposcope under adequate illumination and magnification. Acetic acid  applied to vulvar epithelium. Vascular pattern atypical with abnormal pigmentation at 6:00. Flat. No acetowhite changes appreciated. Plan to biopsy.  Area infiltrated with 2% lidocaine  without epinephrine . Area prepped with antiseptic solution. Biopsy obtained at 6:00. Hemostasis achieved with silver nitrate. Specimen labeled and sent to pathology. Patient tolerated procedure well with no immediate complications. She was given post procedure instructions.   Lab Review No labs on site today  Radiologic Imaging: No imaging on site today    Assessment:  Shatima JIREH VINAS is a 49 y.o. female diagnosed with VIN3 s/p WLE with Dr Elby at [facility] on 05/02/24 with positive margins, s/p adjuvant topical Aldara . Vulvar colposcopy and biopsy performed today.   Last pap: 11/19/2021 NIML/HRHPV negative  Medical co-morbidities complicating care: recent breast cancer diagnosis s/p radiation Plan:   Problem List Items Addressed This Visit       Genitourinary   VIN III (vulvar intraepithelial neoplasia III) - Primary    Vulvar colposcopy and biopsy today.  Plan to follow-up based on results. If positive for VIN, will discuss options for management. If negative, plan for surveillance with repeat visit in 6 months.   The patient's diagnosis, an outline of the further diagnostic and  laboratory studies which will be required, the recommendation, and alternatives  were discussed.  All questions were answered to the patient's satisfaction.  Tinnie Dawn, DNP, AGNP-C, AOCNP [facility] [phone] (clinic)  I personally had a face to face interaction and evaluated the patient jointly with the NP, Ms. Tinnie Dawn.  I have reviewed her history and available records and have performed the key portions of the physical exam including lymph node survey, abdominal exam, pelvic exam with my findings confirming those documented above by the APP.  I have discussed the case with the APP and the patient.  I agree with the above documentation, assessment and plan which was fully formulated by me.  Counseling was completed by me.   I personally saw the patient and performed a substantive portion of this encounter in conjunction with the listed APP as documented above.  Oberon Hehir Isidor Constable, MD  CC:  Watt Bernarda KATHEE DEVONNA [address] [place],  [place] [address] [phone]

## 2024-11-30 LAB — SURGICAL PATHOLOGY

## 2024-12-03 ENCOUNTER — Ambulatory Visit: Payer: Self-pay | Admitting: Nurse Practitioner

## 2024-12-26 ENCOUNTER — Ambulatory Visit

## 2024-12-27 ENCOUNTER — Inpatient Hospital Stay: Attending: Oncology | Admitting: *Deleted

## 2024-12-27 ENCOUNTER — Encounter: Payer: Self-pay | Admitting: Radiation Oncology

## 2024-12-27 ENCOUNTER — Ambulatory Visit
Admission: RE | Admit: 2024-12-27 | Discharge: 2024-12-27 | Disposition: A | Source: Ambulatory Visit | Attending: Radiation Oncology | Admitting: Radiation Oncology

## 2024-12-27 VITALS — BP 135/87 | HR 74 | Temp 98.6°F | Resp 12 | Wt 196.0 lb

## 2024-12-27 DIAGNOSIS — C50911 Malignant neoplasm of unspecified site of right female breast: Secondary | ICD-10-CM

## 2024-12-27 DIAGNOSIS — Z9189 Other specified personal risk factors, not elsewhere classified: Secondary | ICD-10-CM

## 2024-12-27 NOTE — Progress Notes (Signed)
 SUBJECTIVE: Pt returns for her 3 month L-Dex screen.    PAIN:  Are you having pain? No   SOZO SCREENING: Patient was assessed today using the SOZO machine to determine the lymphedema index score. This was compared to her baseline score. It was determined that she is within the recommended range when compared to her baseline and no further action is needed at this time. She will continue SOZO screenings. These are done every 3 months for 2 years post operatively followed by every 6 months for 2 years, and then annually.     L-DEX FLOWSHEETS                L-DEX LYMPHEDEMA SCREENING    Measurement Type Unilateral     L-DEX MEASUREMENT EXTREMITY Upper Extremity     POSITION  Standing     DOMINANT SIDE Right     At Risk Side Right    BASELINE SCORE (UNILATERAL) 1.3    L-DEX SCORE (UNILATERAL) 6.5    VALUE CHANGE (UNILAT) 5.2

## 2024-12-27 NOTE — Progress Notes (Signed)
 Radiation Oncology Follow up Note  Name: Amanda Coffey   Date:   12/27/2024 MRN:  [id number] DOB: 09-23-1976    This 49 y.o. female presents to the clinic today for 14-month follow-up status post whole breast radiation to her right breast for stage Ia ER positive invasive mammary carcinoma.  REFERRING PROVIDER: No ref. provider found  HPI: Patient is a 49 year old female now out close to 7 months having completed whole breast radiation to her right breast for stage Ia ER positive base of mammary carcinoma.  Seen today in routine follow-up she is doing well still feels a little fullness in the inferior portion of her right breast I have assured her that is secondary to scarring in her lumpectomy cavity.  She otherwise specifically denies cough or bone pain..  She is currently on letrozole  tolerating it well without side effect.  She has mammograms and ultrasound set up for March  COMPLICATIONS OF TREATMENT: none  FOLLOW UP COMPLIANCE: keeps appointments   PHYSICAL EXAM:  BP 135/87   Pulse 74   Temp 98.6 F (37 C) (Tympanic)   Resp 12   Wt 196 lb (88.9 kg)   LMP 07/26/2024   BMI 30.70 kg/m  Lungs are clear to A&P cardiac examination essentially unremarkable with regular rate and rhythm. No dominant mass or nodularity is noted in either breast in 2 positions examined. Incision is well-healed. No axillary or supraclavicular adenopathy is appreciated. Cosmetic result is excellent.  Well-developed well-nourished patient in NAD. HEENT reveals PERLA, EOMI, discs not visualized.  Oral cavity is clear. No oral mucosal lesions are identified. Neck is clear without evidence of cervical or supraclavicular adenopathy. Lungs are clear to A&P. Cardiac examination is essentially unremarkable with regular rate and rhythm without murmur rub or thrill. Abdomen is benign with no organomegaly or masses noted. Motor sensory and DTR levels are equal and symmetric in the upper and lower extremities. Cranial nerves  II through XII are grossly intact. Proprioception is intact. No peripheral adenopathy or edema is identified. No motor or sensory levels are noted. Crude visual fields are within normal range.  RADIOLOGY RESULTS: No current films to review  PLAN: At the present time patient is doing well now out close to 7 months with no evidence of disease.  And pleased with her overall progress.  I have asked to see her back in 6 months for follow-up.  Patient knows to call with any concerns.  She continues on letrozole  without side effect.  I would like to take this opportunity to thank you for allowing me to participate in the care of your patient.SABRA Marcey Penton, MD

## 2024-12-28 ENCOUNTER — Ambulatory Visit: Admitting: Radiation Oncology

## 2025-01-31 ENCOUNTER — Ambulatory Visit: Admitting: Oncology

## 2025-02-11 ENCOUNTER — Other Ambulatory Visit

## 2025-02-11 ENCOUNTER — Encounter

## 2025-04-11 ENCOUNTER — Inpatient Hospital Stay: Admitting: *Deleted

## 2025-06-05 ENCOUNTER — Inpatient Hospital Stay

## 2025-06-24 ENCOUNTER — Ambulatory Visit: Admitting: Radiation Oncology

## 2025-12-30 ENCOUNTER — Ambulatory Visit: Admitting: Radiation Oncology
